# Patient Record
Sex: Female | Born: 1970 | Race: White | Hispanic: No | Marital: Married | Smoking: Current every day smoker
Health system: Southern US, Community
[De-identification: ages and names within clinical notes are randomized; demographics above are authoritative.]

## PROBLEM LIST (undated history)

## (undated) DIAGNOSIS — M797 Fibromyalgia: Secondary | ICD-10-CM

## (undated) DIAGNOSIS — F419 Anxiety disorder, unspecified: Secondary | ICD-10-CM

## (undated) DIAGNOSIS — I10 Essential (primary) hypertension: Secondary | ICD-10-CM

## (undated) HISTORY — PX: KNEE SURGERY: SHX244

## (undated) HISTORY — PX: TUBAL LIGATION: SHX77

## (undated) HISTORY — DX: Fibromyalgia: M79.7

## (undated) HISTORY — PX: CHOLECYSTECTOMY: SHX55

---

## 2005-05-20 HISTORY — PX: CARDIAC CATHETERIZATION: SHX172

## 2012-03-27 IMAGING — CT CT HEAD W/O CM
1 of 2 series · 13 of 30 positions shown, 17 images · non-contrast
Comparison: None

CT HEAD

CLINICAL DATA: Tingling left neck.

CT HEAD WITHOUT CONTRAST
CT CERVICAL SPINE WITHOUT CONTRAST
TECHNIQUE: Multidetector CT imaging of the head and cervical spine
was performed following the standard protocol without intravenous
contrast.  Multiplanar CT image reconstructions of the cervical
spine were also generated.

[Series 4: cervical spine · axial · 0.27mm/px · z∈[+87,+252]mm · 13 of 78 slices shown, 17 images]
[im 6/78  brain]
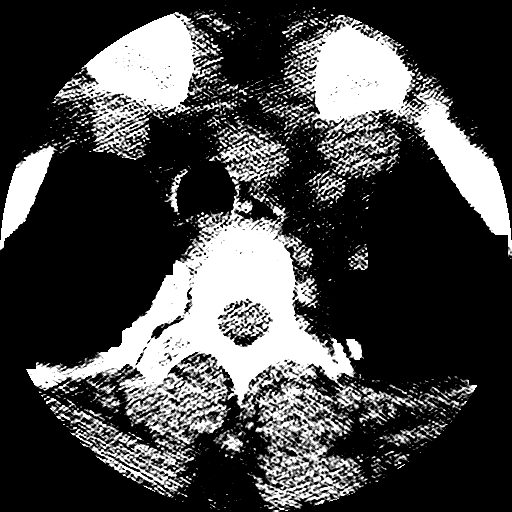
[im 6/78  bone]
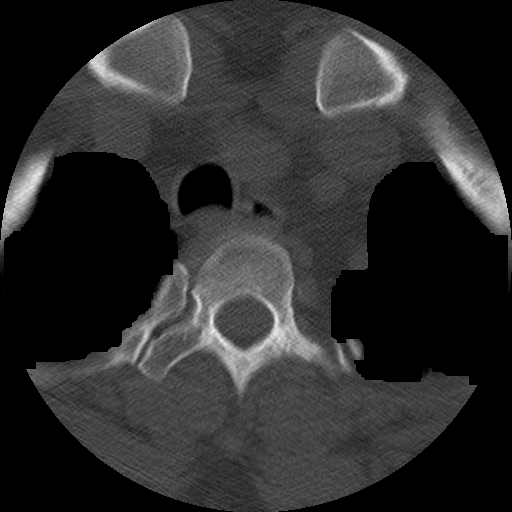
[im 12/78  brain]
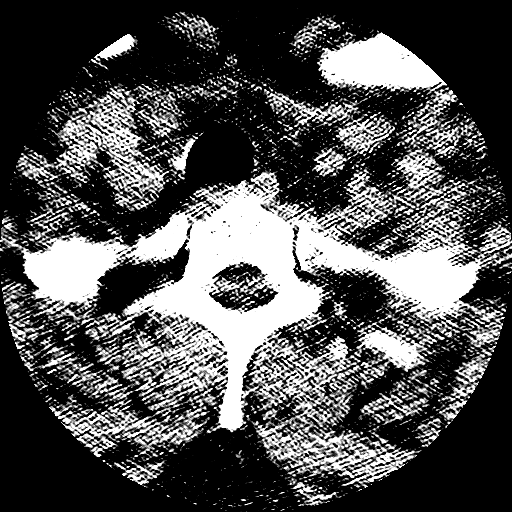
[im 17/78  brain]
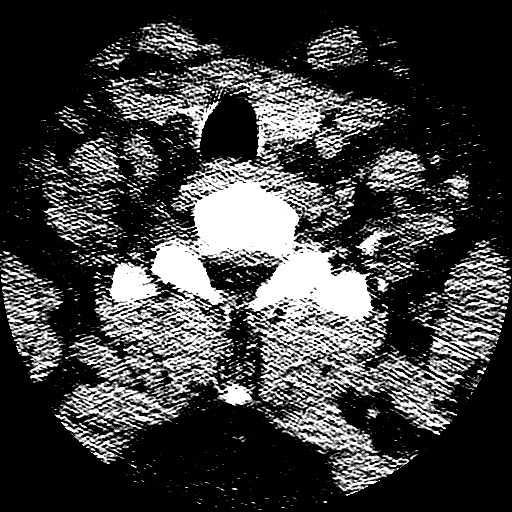
[im 23/78  brain]
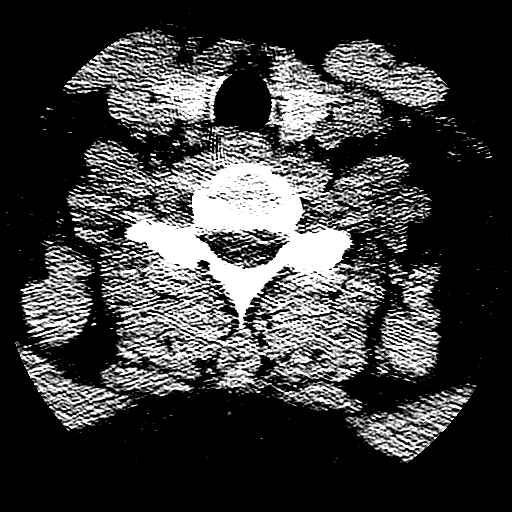
[im 28/78  brain]
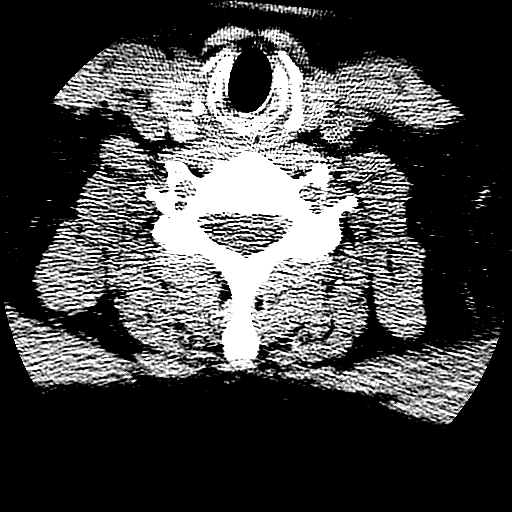
[im 28/78  bone]
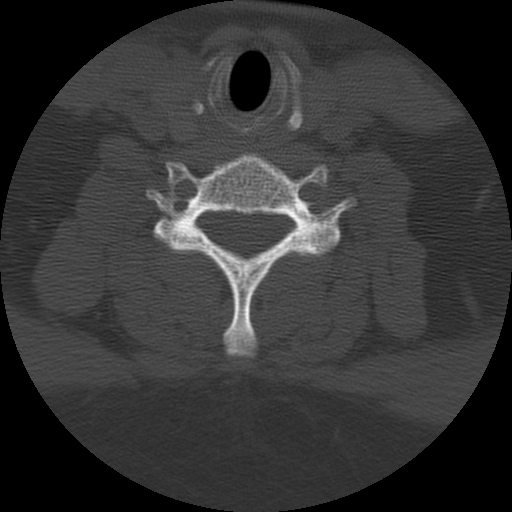
[im 34/78  brain]
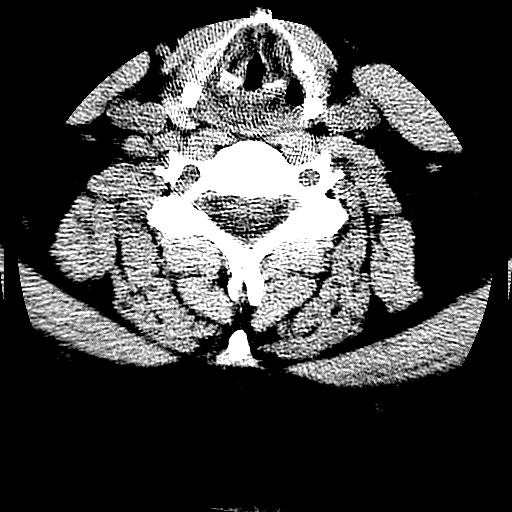
[im 39/78  brain]
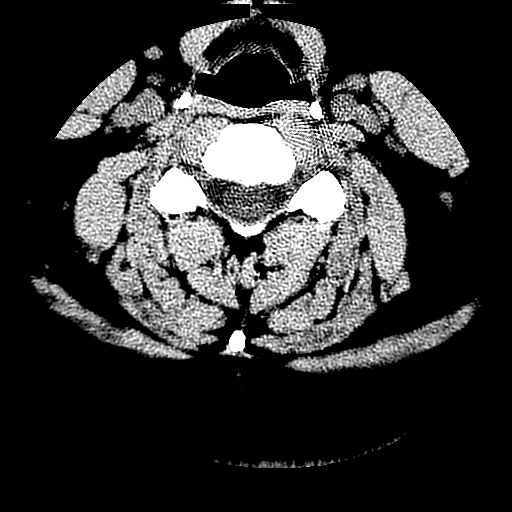
[im 45/78  brain]
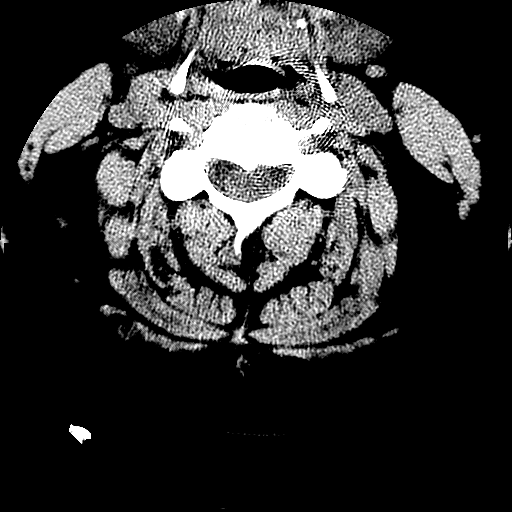
[im 50/78  brain]
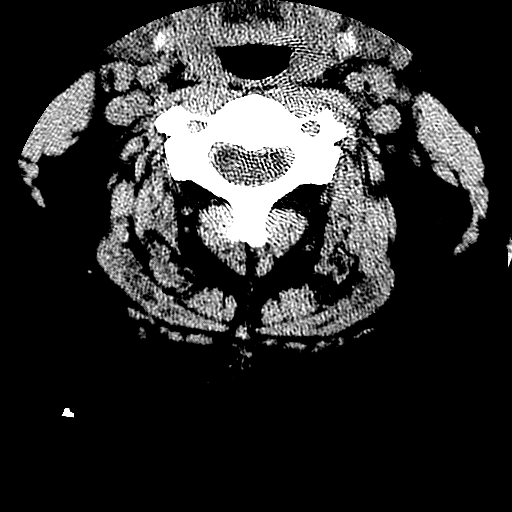
[im 50/78  bone]
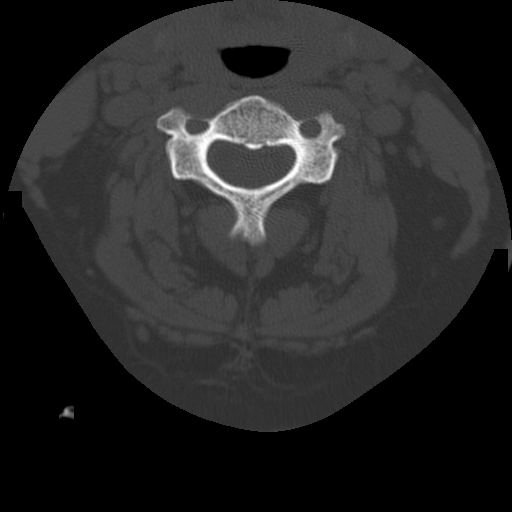
[im 56/78  brain]
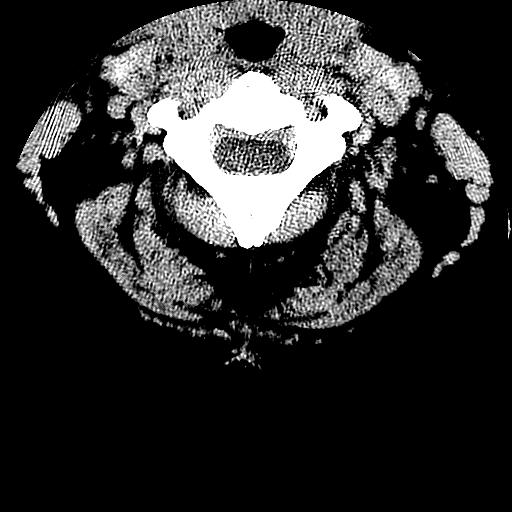
[im 61/78  brain]
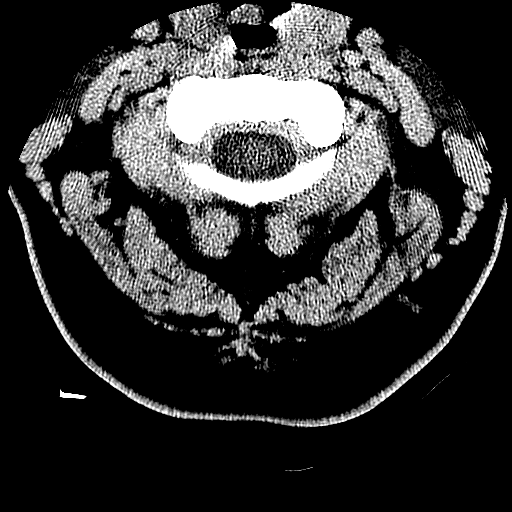
[im 67/78  brain]
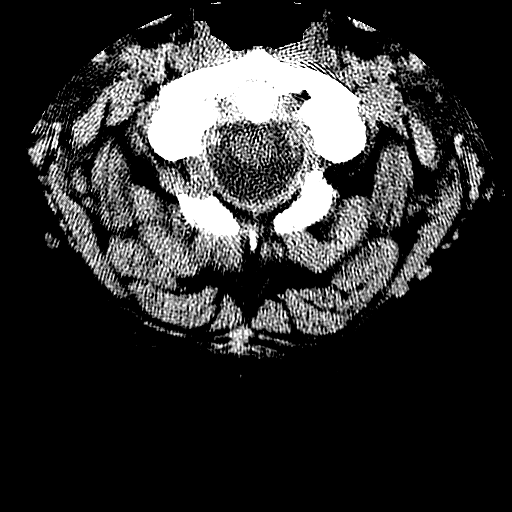
[im 72/78  brain]
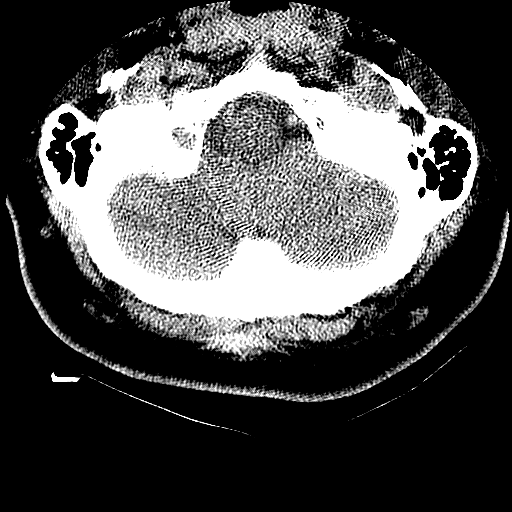
[im 72/78  bone]
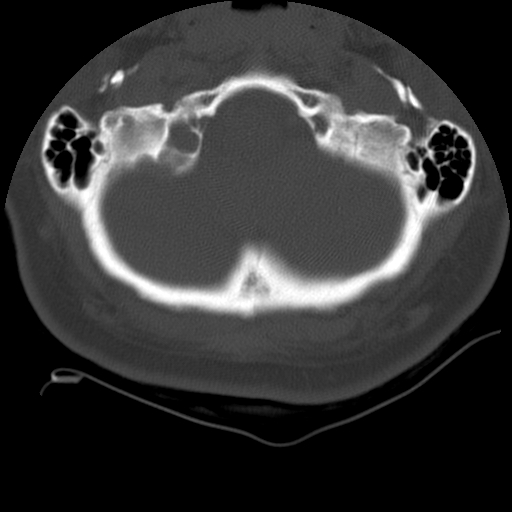

[13 of 30 positions shown; findings below may reference images not displayed]

FINDINGS: Ventricles are normal.  Negative for infarct or mass
lesion.  Negative for hemorrhage.  No evidence of demyelinating
disease.

There is mucosal thickening throughout the left sphenoid sinus
compatible with  chronic sinusitis.
IMPRESSION: Normal CT of the brain

Sphenoid chronic sinusitis

CT CERVICAL SPINE
FINDINGS: Normal cervical alignment.  Negative for fracture or mass
lesion.  Disc degeneration and mild spurring at C3-4.  No
significant spinal stenosis.
IMPRESSION: Mild cervical degenerative changes.  No acute abnormality.

## 2014-02-02 ENCOUNTER — Ambulatory Visit
Admission: RE | Admit: 2014-02-02 | Discharge: 2014-02-02 | Disposition: A | Discharge status: Home / Self Care | Payer: Self-pay | Source: Ambulatory Visit

## 2014-03-16 ENCOUNTER — Ambulatory Visit (RURAL_HEALTH_CENTER): Payer: Self-pay | Admitting: Family

## 2014-03-16 DIAGNOSIS — Z029 Encounter for administrative examinations, unspecified: Secondary | ICD-10-CM

## 2014-03-28 ENCOUNTER — Encounter (RURAL_HEALTH_CENTER): Payer: Self-pay | Admitting: Family

## 2014-03-30 ENCOUNTER — Ambulatory Visit (RURAL_HEALTH_CENTER): Payer: PRIVATE HEALTH INSURANCE | Admitting: Family

## 2014-03-30 ENCOUNTER — Telehealth (RURAL_HEALTH_CENTER): Payer: Self-pay

## 2014-03-30 ENCOUNTER — Encounter (RURAL_HEALTH_CENTER): Payer: Self-pay | Admitting: Family

## 2014-03-30 ENCOUNTER — Other Ambulatory Visit (RURAL_HEALTH_CENTER): Payer: Self-pay | Admitting: Family

## 2014-03-30 VITALS — BP 142/84 | HR 80 | Temp 97.8°F | Resp 16 | Ht 64.0 in | Wt 225.5 lb

## 2014-03-30 DIAGNOSIS — F419 Anxiety disorder, unspecified: Secondary | ICD-10-CM

## 2014-03-30 DIAGNOSIS — R52 Pain, unspecified: Secondary | ICD-10-CM

## 2014-03-30 DIAGNOSIS — Z7689 Persons encountering health services in other specified circumstances: Secondary | ICD-10-CM

## 2014-03-30 MED ORDER — GABAPENTIN 300 MG PO CAPS
300.0000 mg | ORAL_CAPSULE | Freq: Three times a day (TID) | ORAL | Status: DC
Start: ? — End: 2014-03-30

## 2014-03-30 MED ORDER — ALPRAZOLAM 0.25 MG PO TABS
0.2500 mg | ORAL_TABLET | Freq: Three times a day (TID) | ORAL | Status: DC | PRN
Start: ? — End: 2014-03-30

## 2014-03-30 NOTE — Progress Notes (Signed)
Subjective:    Patient ID: Anita Aguilar is a 43 y.o. female.    HPI     Patient presents today to establish care. She recently moved back to this area from [place]. She states Dr. Nedra Aguilar was her PCP years ago.    Patient complains of widespread pain. She states this has been going on for the past 4-5 months. She describes the pain as being in both the joints and the muscles. She states that she has a history of back pain and right knee pain. She states she has had surgery on the right knee. She states it hurts to move, and it even hurts to have her husband hold her hand. She states she has tried heat, ice, and Tylenol without improvement.    Patient also reports a history of a cardiac cath about 7 years ago at [facility] in [place], [place].  She is unsure whether she had stent placement.    Patient also requests a refill of her Xanax today which she states she uses for panic disorder.    Review of Systems   Constitutional: Negative for fever and chills.   HENT: Negative.    Respiratory: Negative for shortness of breath.    Cardiovascular: Negative for chest pain.   Gastrointestinal: Negative for vomiting, abdominal pain and diarrhea.   Genitourinary: Negative.    Musculoskeletal: Positive for myalgias (widespread) and arthralgias (widespread).   Skin: Negative for rash.   Neurological: Negative for headaches.   Psychiatric/Behavioral: Negative for suicidal ideas. The patient is nervous/anxious.            Objective:    Physical Exam   Constitutional: She is oriented to person, place, and time. She appears well-developed and well-nourished.   HENT:   Head: Normocephalic and atraumatic.   Mouth/Throat: Oropharynx is clear and moist.   Neck: Normal range of motion. Neck supple. No thyromegaly present.   Cardiovascular: Normal rate, regular rhythm and normal heart sounds.    Pulmonary/Chest: Effort normal and breath sounds normal.   Abdominal: Soft. Bowel sounds are normal. She exhibits no mass.  There is no tenderness.   Musculoskeletal:   Patient has multiple points of tenderness to palpation, both bony and muscular, found over the back as well as both the upper and lower extremities. No visible deformities. No warmth, redness, or bruising.   Neurological: She is alert and oriented to person, place, and time.   Skin: Skin is warm and dry.   Psychiatric: Her mood appears anxious (tearful at times).           Assessment:         establish care    Pain    Anxiety  Plan:       Records release from:  [facility] in [place], [place] ph: [phone];  [facility] in [place], [place] ph: [phone]: PCP "Anita Aguilar"; saw her 1-2 times; and [facility] in [place], [place]    Gabapentin 300 mg 3 times a day. I have given the patient instruction on how to slowly taper up her dose.    I've advised the patient to follow-up for fasting labs to further investigate this widespread pain.    The patient does have some Xanax remaining from her current prescription. I have advised her that once I review her records I will consider refilling this medication for her.    We will follow-up with the patient based on her results.  Further follow-up will be dictated by her lab  results as well.

## 2014-03-30 NOTE — Telephone Encounter (Signed)
-----   Message from Kalman Shan, NP sent at 03/30/2014  3:47 PM EST -----  Regarding: xanax Rx  Please advise the patient that I received her records from [facility], NP in [place].  (still waiting on the others).  I have provided her a refill of her xanax as requested.    Thanks,  [employer]

## 2014-03-30 NOTE — Telephone Encounter (Signed)
L/m to call the office.

## 2014-03-30 NOTE — Progress Notes (Signed)
Reviewed and agree with treatment plan.

## 2014-04-01 NOTE — Telephone Encounter (Signed)
Left message to return call 

## 2014-04-05 NOTE — Telephone Encounter (Signed)
L/m to call the office.

## 2014-04-06 NOTE — Telephone Encounter (Signed)
L/m to call the office.

## 2014-04-08 NOTE — Telephone Encounter (Signed)
Left message to return call 

## 2014-04-11 ENCOUNTER — Ambulatory Visit (RURAL_HEALTH_CENTER): Payer: PRIVATE HEALTH INSURANCE

## 2014-04-11 ENCOUNTER — Other Ambulatory Visit
Admission: RE | Admit: 2014-04-11 | Discharge: 2014-04-11 | Disposition: A | Discharge status: Home / Self Care | Payer: PRIVATE HEALTH INSURANCE | Source: Ambulatory Visit | Attending: Family | Admitting: Family

## 2014-04-11 ENCOUNTER — Other Ambulatory Visit (RURAL_HEALTH_CENTER): Payer: PRIVATE HEALTH INSURANCE | Admitting: Family

## 2014-04-11 DIAGNOSIS — E669 Obesity, unspecified: Secondary | ICD-10-CM

## 2014-04-11 LAB — COMPREHENSIVE METABOLIC PANEL
ALT: 20 U/L (ref 0–55)
AST (SGOT): 14 U/L (ref 10–42)
Albumin/Globulin Ratio: 1.31 Ratio (ref 0.70–1.50)
Albumin: 3.8 gm/dL (ref 3.5–5.0)
Alkaline Phosphatase: 64 U/L (ref 40–145)
Anion Gap: 14.2 mMol/L (ref 7.0–18.0)
BUN / Creatinine Ratio: 11.7 Ratio (ref 10.0–30.0)
BUN: 7 mg/dL (ref 7–22)
Bilirubin, Total: 0.4 mg/dL (ref 0.1–1.2)
CO2: 22.1 mMol/L (ref 20.0–30.0)
Calcium: 9.3 mg/dL (ref 8.5–10.5)
Chloride: 108 mMol/L (ref 98–110)
Creatinine: 0.6 mg/dL (ref 0.60–1.20)
EGFR: >60 mL/min/{1.73_m2}
Globulin: 2.9 gm/dL (ref 2.0–4.0)
Glucose: 105 mg/dL — ABNORMAL HIGH (ref 70–99)
Osmolality Calc: 278 mOsm/kg (ref 275–300)
Potassium: 4.3 mMol/L (ref 3.5–5.3)
Protein, Total: 6.7 gm/dL (ref 6.0–8.3)
Sodium: 140 mMol/L (ref 136–147)

## 2014-04-11 LAB — LIPID PANEL
Cholesterol: 176 mg/dL (ref 75–199)
Coronary Heart Disease Risk: 5.5
HDL: 32 mg/dL — ABNORMAL LOW (ref 45–65)
LDL Calculated: 120 mg/dL
Triglycerides: 120 mg/dL (ref 10–150)
VLDL: 24 (ref 0–40)

## 2014-04-11 LAB — CBC AND DIFFERENTIAL
Bands: 4 % (ref 0–10)
Basophils %: 1 % (ref 0.0–3.0)
Basophils Absolute: 0.1 10*3/uL (ref 0.0–0.3)
Eosinophils %: 1 % (ref 0.0–7.0)
Eosinophils Absolute: 0.1 10*3/uL (ref 0.0–0.8)
Hematocrit: 46.2 % (ref 36.0–48.0)
Hemoglobin: 15.6 gm/dL (ref 12.0–16.0)
Lymphocytes Absolute: 2 10*3/uL (ref 0.6–5.1)
Lymphocytes: 28 % (ref 15.0–46.0)
MCH: 30 pg (ref 28–35)
MCHC: 34 gm/dL (ref 32–36)
MCV: 90 fL (ref 80–100)
MPV: 9.2 fL (ref 6.0–10.0)
Monocytes Absolute: 0.5 10*3/uL (ref 0.1–1.7)
Monocytes: 7 % (ref 3.0–15.0)
Neutrophils %: 59 % (ref 42.0–78.0)
Neutrophils Absolute: 4.4 10*3/uL (ref 1.7–8.6)
PLT CT: 263 10*3/uL (ref 130–440)
RBC: 5.15 10*6/uL — ABNORMAL HIGH (ref 3.80–5.00)
RDW: 12.1 % (ref 11.0–14.0)
WBC: 7 10*3/uL (ref 4.0–11.0)

## 2014-04-11 LAB — POCT URINALYSIS DIPSTIX (10)(MULTI-TEST)
Bilirubin, UA POCT: NEGATIVE
Blood, UA POCT: NEGATIVE
Glucose, UA POCT: NEGATIVE mg/dL
Ketones, UA POCT: NEGATIVE mg/dL
Nitrite, UA POCT: NEGATIVE
POCT Leukocytes, UA: NEGATIVE
POCT Spec Gravity, UA: 1.01 (ref 1.001–1.035)
POCT pH, UA: 5 (ref 5–8)
Protein, UA POCT: NEGATIVE mg/dL
Urobilinogen, UA: 0.2 mg/dL

## 2014-04-11 LAB — TSH: TSH: 1.64 u[IU]/mL (ref 0.40–4.20)

## 2014-04-11 NOTE — Progress Notes (Signed)
Date Specimen Drawn:  04/11/2014   Time Specimen Drawn:  8:52 AM   Test(s) Ordered:  Cbc,cmp,lipid,tsh   Disposition:  n/a   Patient's Tolerance:  Good   Location Specimen Drawn:  Right hand

## 2014-04-11 NOTE — Telephone Encounter (Signed)
Pt in office for labwork aware of below note

## 2014-04-12 ENCOUNTER — Telehealth (RURAL_HEALTH_CENTER): Payer: Self-pay | Admitting: Family

## 2014-04-12 ENCOUNTER — Other Ambulatory Visit (RURAL_HEALTH_CENTER): Payer: Self-pay | Admitting: Family

## 2014-04-12 ENCOUNTER — Encounter (RURAL_HEALTH_CENTER): Payer: Self-pay | Admitting: Family

## 2014-04-12 ENCOUNTER — Other Ambulatory Visit
Admission: RE | Admit: 2014-04-12 | Discharge: 2014-04-12 | Disposition: A | Discharge status: Home / Self Care | Payer: PRIVATE HEALTH INSURANCE | Source: Ambulatory Visit | Attending: Family | Admitting: Family

## 2014-04-12 ENCOUNTER — Telehealth (RURAL_HEALTH_CENTER): Payer: Self-pay

## 2014-04-12 DIAGNOSIS — R7301 Impaired fasting glucose: Secondary | ICD-10-CM

## 2014-04-12 NOTE — Telephone Encounter (Signed)
L/m to call the office.

## 2014-04-12 NOTE — Telephone Encounter (Signed)
Pt aware.

## 2014-04-12 NOTE — Telephone Encounter (Signed)
-----   Message from Kalman Shan, NP sent at 04/12/2014 11:08 AM EST -----  Normal A1C.  No diabetes or pre-diabetes.

## 2014-04-12 NOTE — Telephone Encounter (Signed)
-----   Message from Kalman Shan, NP sent at 04/12/2014  8:26 AM EST -----  Labs look good overall.  I've added an A1C b/c fasting glucose was slightly elevated.  Will follow up with patient when that result is available.    Thanks,  [employer]

## 2014-04-13 NOTE — Telephone Encounter (Signed)
Pt aware.

## 2014-04-19 LAB — HEMOGLOBIN A1C: Hgb A1C, %: 5.6 %

## 2014-04-23 ENCOUNTER — Emergency Department: Payer: PRIVATE HEALTH INSURANCE

## 2014-04-23 ENCOUNTER — Emergency Department
Admission: EM | Admit: 2014-04-23 | Discharge: 2014-04-23 | Disposition: A | Discharge status: Home / Self Care | Payer: PRIVATE HEALTH INSURANCE | Attending: Emergency Medicine | Admitting: Emergency Medicine

## 2014-04-23 DIAGNOSIS — H6020 Malignant otitis externa, unspecified ear: Secondary | ICD-10-CM | POA: Insufficient documentation

## 2014-04-23 MED ORDER — CIPROFLOXACIN-DEXAMETHASONE 0.3-0.1 % OT SUSP
4.0000 [drp] | Freq: Two times a day (BID) | OTIC | Status: AC
Start: 2014-04-23 — End: 2014-04-30

## 2014-04-23 NOTE — ED Notes (Signed)
Neck stiffness/pain x 1 week - right ear pain

## 2014-04-23 NOTE — Discharge Instructions (Signed)
External Ear Infection (Adult)    External otitis (also called "swimmer's ear")is an infection in the ear canal. It is often caused by bacteria or fungus. It can occur a few days after water gets trapped in the ear canal (fromswimming or bathing). It can also occur after cleaning too deeply in the ear canal with a cotton swab or other object. Sometimes, hair care products get into the ear canal and cause this problem.  Symptoms can include pain,fever,itching, redness, drainage, or swelling of the ear canal. Temporary hearing loss may also occur.  Home care   Do not try to clean the ear canal. This can push pus and bacteria deeper into the canal.   Use prescribed ear drops as directed. These help reduce swelling and fight the infection. If an ear wick was placed in the ear canal, apply drops right onto the end of the wick. The wick will draw the medication into the ear canal even if it is swollen closed.   A cotton ball may be loosely placed in the outer ear to absorb any drainage.   You may use acetaminophen or ibuprofen to control pain, unless another medication was prescribed. Note: If you have chronic liver or kidney disease or ever had a stomach ulcer or GI bleeding, talk to your health care provider before taking any of these medications.   Do not allow water to get into your ear when bathing. Also, avoid swimming until the infection has cleared.  Prevention   Keep your ears dry. This helps lower the risk of infection. Dry your ears with a towel or hair dryer after getting wet. Also, use ear plugs when swimming.   Do not stick any objects in the ear to remove wax.   If you feel water trapped in your ear, use ear drops right away. You can get these drops over the counter at most drugstores. They work by removing water from the ear canal.  Follow-up care  Follow up with your health care provider in one week, or as advised.  When to seek medical advice  Call your health care provider right away if  any of these occur:   Ear pain becomes worse or doesn't improve after 3 days of treatment   Redness or swelling of the outer ear occurs or gets worse   Headache   Painful or stiff neck   Drowsiness or confusion   Fever of 100.4F (38C) or higher, or as directed by your health care provider   Seizure   2000-2015 [employer] [address], [place] [address]. All rights reserved. This information is not intended as a substitute for professional medical care. Always follow your healthcare professional's instructions.

## 2014-04-23 NOTE — ED Provider Notes (Addendum)
Physician/Midlevel provider first contact with patient: 04/23/14 1307         History     Chief Complaint   Patient presents with   . Neck Pain     Patient is a 43 y.o. female presenting with neck pain and ear pain. The history is provided by the patient. No language interpreter was used.   Neck Pain  Pain location:  R side  Quality:  Aching  Pain radiates to:  Does not radiate  Pain severity:  Mild  Duration:  3 days  Timing:  Constant  Chronicity:  New  Context: not recent injury    Associated symptoms: no bladder incontinence, no bowel incontinence, no fever, no headaches, no numbness and no paresis    Otalgia  Location:  Right (Right greater than left)  Behind ear:  No abnormality  Quality:  Aching (Tinnitus with soreness)  Severity:  Moderate  Onset quality:  Gradual  Duration:  1 week  Timing:  Constant  Associated symptoms: neck pain    Associated symptoms: no abdominal pain, no congestion, no ear discharge, no fever, no headaches, no rash, no rhinorrhea and no sore throat             History reviewed. No pertinent past medical history.    Past Surgical History   Procedure Laterality Date   . Tubal ligation     . Knee surgery     . Cholecystectomy     . Cardiac catheterization  2007       History reviewed. No pertinent family history.    Social  History   Substance Use Topics   . Smoking status: Current Every Day Smoker -- 1.00 packs/day     Types: Cigarettes   . Smokeless tobacco: Never Used   . Alcohol Use: No       .     Allergies   Allergen Reactions   . Asa [Aspirin]    . Ativan [Lorazepam]    . Codeine    . Klonopin [Clonazepam]    . Morphine    . Penicillins    . Zoloft [Sertraline]        Current/Home Medications    ALPRAZOLAM (XANAX) 0.25 MG TABLET    Take 1 tablet (0.25 mg total) by mouth 3 (three) times daily as needed.        Review of Systems   Constitutional: Negative for fever.   HENT: Positive for ear pain. Negative for congestion, ear discharge, rhinorrhea and sore throat.     Gastrointestinal: Negative for abdominal pain and bowel incontinence.   Genitourinary: Negative for bladder incontinence.   Musculoskeletal: Positive for neck pain.   Skin: Negative for rash.   Neurological: Negative for numbness and headaches.   All other systems reviewed and are negative.      Physical Exam    BP: 138/86 mmHg, Heart Rate: 90, Temp: 99 F (37.2 C), Resp Rate: 18, SpO2: 98 %, Weight: 99.791 kg    Physical Exam   Constitutional: She is oriented to person, place, and time. She appears well-developed and well-nourished.   HENT:   Head: Normocephalic and atraumatic.   There is clinical external otitis in the right ear. The TM appears to be normal. There is mild tragal tenderness. The pinna is normal. There is no discharge. The external canal is widely patent. The left ear shows a normal TM, normal pinna and tragus. There is perhaps minimal external otitis in this ear as well.  Neck: Normal range of motion. Neck supple.   Mild tenderness right paraspinous and trapezius. No supraspinous tenderness. No swelling. No rash. No mass   Neurological: She is alert and oriented to person, place, and time. No cranial nerve deficit. Coordination normal.   Skin: Skin is warm. No rash noted.   Psychiatric: She has a normal mood and affect. Her behavior is normal.         MDM and ED Course     ED Medication Orders     None              MDM       Procedures    Clinical Impression & Disposition     Clinical Impression  Final diagnoses:   Acute malignant otitis externa, unspecified laterality        ED Disposition     Discharge Kathreen Cosier Mizzell discharge to home/self care.    Condition at disposition: Stable             New Prescriptions    CIPROFLOXACIN-DEXAMETHASONE (CIPRODEX) OTIC SUSPENSION    Place 4 drops into both ears 2 (two) times daily.                   Charissa Bash, MD  04/23/14 1335    Charissa Bash, MD  04/23/14 [phone]

## 2014-05-01 ENCOUNTER — Emergency Department: Payer: PRIVATE HEALTH INSURANCE

## 2014-05-01 ENCOUNTER — Emergency Department
Admission: EM | Admit: 2014-05-01 | Discharge: 2014-05-01 | Disposition: A | Discharge status: Home / Self Care | Payer: PRIVATE HEALTH INSURANCE | Attending: Emergency Medicine | Admitting: Emergency Medicine

## 2014-05-01 DIAGNOSIS — R110 Nausea: Secondary | ICD-10-CM | POA: Insufficient documentation

## 2014-05-01 DIAGNOSIS — R630 Anorexia: Secondary | ICD-10-CM | POA: Insufficient documentation

## 2014-05-01 DIAGNOSIS — R109 Unspecified abdominal pain: Secondary | ICD-10-CM | POA: Insufficient documentation

## 2014-05-01 DIAGNOSIS — N939 Abnormal uterine and vaginal bleeding, unspecified: Secondary | ICD-10-CM | POA: Insufficient documentation

## 2014-05-01 HISTORY — DX: Anxiety disorder, unspecified: F41.9

## 2014-05-01 LAB — URINALYSIS WITH MICROSCOPIC, REFLEX COMPREHENSIVE URINE CULTURE
Bilirubin, UA: NEGATIVE mg/dL
Glucose, UA: NEGATIVE mg/dL
Ketones UA: NEGATIVE mg/dL
Leukocyte Esterase, UA: NEGATIVE Leu/uL
Nitrite, UA: NEGATIVE
Protein, UR: NEGATIVE mg/dL
Urine Specific Gravity: 1.015 (ref 1.001–1.040)
Urobilinogen, UA: 0.2 mg/dL
pH, Urine: 6.5 pH (ref 5.0–8.0)

## 2014-05-01 LAB — COMPREHENSIVE METABOLIC PANEL
ALT: 24 U/L (ref 0–55)
AST (SGOT): 17 U/L (ref 10–42)
Albumin/Globulin Ratio: 1.14 Ratio (ref 0.70–1.50)
Albumin: 3.6 gm/dL (ref 3.5–5.0)
Alkaline Phosphatase: 56 U/L (ref 40–145)
Anion Gap: 14.2 mMol/L (ref 7.0–18.0)
BUN / Creatinine Ratio: 10 Ratio (ref 10.0–30.0)
BUN: 6 mg/dL — ABNORMAL LOW (ref 7–22)
Bilirubin, Total: 0.4 mg/dL (ref 0.1–1.2)
CO2: 22 mMol/L (ref 20.0–30.0)
Calcium: 9.3 mg/dL (ref 8.5–10.5)
Chloride: 107 mMol/L (ref 98–110)
Creatinine: 0.6 mg/dL (ref 0.60–1.20)
EGFR: >60 mL/min/{1.73_m2}
Globulin: 3.2 gm/dL (ref 2.0–4.0)
Glucose: 101 mg/dL — ABNORMAL HIGH (ref 70–99)
Osmolality Calc: 276 mOsm/kg (ref 275–300)
Potassium: 3.8 mMol/L (ref 3.5–5.3)
Protein, Total: 6.8 gm/dL (ref 6.0–8.3)
Sodium: 140 mMol/L (ref 136–147)

## 2014-05-01 LAB — CBC AND DIFFERENTIAL
Basophils %: 1.2 % (ref 0.0–3.0)
Basophils Absolute: 0.1 10*3/uL (ref 0.0–0.3)
Eosinophils %: 1.6 % (ref 0.0–7.0)
Eosinophils Absolute: 0.1 10*3/uL (ref 0.0–0.8)
Hematocrit: 44.5 % (ref 36.0–48.0)
Hemoglobin: 15.6 gm/dL (ref 12.0–16.0)
Lymphocytes Absolute: 2.1 10*3/uL (ref 0.6–5.1)
Lymphocytes: 26 % (ref 15.0–46.0)
MCH: 30 pg (ref 28–35)
MCHC: 35 gm/dL (ref 31–36)
MCV: 85 fL (ref 80–100)
MPV: 9 fL (ref 6.0–10.0)
Monocytes Absolute: 0.7 10*3/uL (ref 0.1–1.7)
Monocytes: 8.3 % (ref 3.0–15.0)
Neutrophils %: 62.9 % (ref 42.0–78.0)
Neutrophils Absolute: 5.1 10*3/uL (ref 1.7–8.6)
PLT CT: 274 10*3/uL (ref 130–440)
RBC: 5.2 10*6/uL — ABNORMAL HIGH (ref 3.80–5.00)
RDW: 11.5 % (ref 10.5–14.5)
WBC: 8.1 10*3/uL (ref 4.00–11.00)

## 2014-05-01 LAB — URINE HCG QUALITATIVE: Urine HCG Qualitative: NEGATIVE

## 2014-05-01 MED ORDER — ONDANSETRON HCL 4 MG PO TABS
4.0000 mg | ORAL_TABLET | Freq: Three times a day (TID) | ORAL | Status: DC | PRN
Start: 2014-05-01 — End: 2014-06-17

## 2014-05-01 MED ORDER — METRONIDAZOLE 500 MG PO TABS
500.0000 mg | ORAL_TABLET | Freq: Three times a day (TID) | ORAL | Status: DC
Start: 2014-05-01 — End: 2014-05-01

## 2014-05-01 MED ORDER — PROMETHAZINE HCL 25 MG PO TABS
25.0000 mg | ORAL_TABLET | Freq: Four times a day (QID) | ORAL | Status: DC | PRN
Start: 2014-05-01 — End: 2014-05-01

## 2014-05-01 MED ORDER — HYDROMORPHONE HCL 2 MG PO TABS
2.0000 mg | ORAL_TABLET | Freq: Four times a day (QID) | ORAL | Status: DC | PRN
Start: 2014-05-01 — End: 2014-05-01

## 2014-05-01 MED ORDER — ONDANSETRON HCL 4 MG/2ML IJ SOLN
4.0000 mg | Freq: Once | INTRAMUSCULAR | Status: AC
Start: 2014-05-01 — End: 2014-05-01
  Administered 2014-05-01: 4 mg via INTRAVENOUS

## 2014-05-01 MED ORDER — HYDROCODONE-ACETAMINOPHEN 5-325 MG PO TABS
1.0000 | ORAL_TABLET | Freq: Four times a day (QID) | ORAL | Status: DC | PRN
Start: 2014-05-01 — End: 2014-06-17

## 2014-05-01 MED ORDER — HYDROMORPHONE HCL 2 MG/ML IJ SOLN
1.0000 mg | Freq: Once | INTRAMUSCULAR | Status: AC
Start: 2014-05-01 — End: 2014-05-01
  Administered 2014-05-01: 1 mg via INTRAVENOUS

## 2014-05-01 MED ORDER — CIPROFLOXACIN HCL 500 MG PO TABS
500.0000 mg | ORAL_TABLET | Freq: Two times a day (BID) | ORAL | Status: DC
Start: 2014-05-01 — End: 2014-05-01

## 2014-05-01 MED ORDER — SODIUM CHLORIDE 0.9 % IV BOLUS
1000.0000 mL | Freq: Once | INTRAVENOUS | Status: AC
Start: 2014-05-01 — End: 2014-05-01
  Administered 2014-05-01: 1000 mL via INTRAVENOUS

## 2014-05-01 NOTE — ED Notes (Signed)
Bed: ED5-A  Expected date:   Expected time:   Means of arrival:   Comments:  EMS

## 2014-05-01 NOTE — Discharge Instructions (Signed)
Abdominal Pain, Unknown Cause (Female)    The exact cause of your abdominal (stomach) pain is not certain. This does not mean that this is something to worry about, or the right tests were not done. Everyone likes to know the exact cause of the problem, but sometimes with abdominal pain, there is no clear-cut cause, and this could be a good thing. The good news is that your symptoms can be treated, and you will feel better.  Your condition does not seem serious now; however, sometimes the signs of a serious problem may take more time to appear. For this reason,it is important for you to watch for any new symptoms, problems,or worsening of your condition.  Over the next few days, the abdominal pain may come and go, or be continuous. Other common symptoms can include nausea and vomiting. Sometimes it can be difficult to tell if you feel nauseous, you may just feel bad and not associate that feeling with nausea. Constipation, diarrhea, and a fever may go along with the pain.  The pain may continue even if treated correctly over the following days. Depending on how things go, sometimes the cause can become clear and may require further or different treatment. Additional evaluations, medications, or tests may be needed.  Home care  Your health care provider may prescribe medications for pain, symptoms, or an infection. Follow the health care provider's instructions for taking these medications.  General care   Rest until your next exam. No strenuous activities.   Try to find positions that ease discomfort. A small pillow placed on the abdomen may help relieve pain.   Something warm on your abdomen (such as a heating pad) may help, but be careful not to burn yourself.  Diet   Do not force yourself to eat, especially if having cramps, vomiting, or diarrhea.   Water is important so you do not get dehydrated. Soup may also be good. Sports drinks may also help, especially if they are not too acidic. Make sure you  don't drink sugary drinks as this can make things worse. Take liquids in small amounts. Do not guzzle them.   Caffeine sometimes makes the pain and cramping worse.   Avoid dairy products if you have vomiting or diarrhea.   Don't eat large amounts at a time. Wait a few minutes between bites.   Eat a diet low in fiber (called a low-residue diet). Foods allowed include refined breads, white rice, fruit and vegetable juices without pulp, tender meats. These foods will pass more easily through the intestine.   Avoid whole-grain foods, whole fruits and vegetables, meats, seeds and nuts, fried or fatty foods, dairy, alcohol and spicy foods until your symptoms go away.  Follow-up care  Follow up with your health care provider as instructed, or if your pain does not begin to improve in the next 24 hours.  When to seek medical advice  Call your health care provider right away if any of these occur:   Pain gets worse or moves to the right lower abdomen   New or worsening vomiting or diarrhea   Swelling of the abdomen   Unable to pass stool for more than three days   Fever of 100.4F (38C) or higher, or as directed by your healthcare provider.   Blood in vomit or bowel movements (dark red or black color)   Jaundice (yellow color of eyes and skin)   Weakness, dizziness   Chest, arm, back, neck or jaw pain   Unexpected   vaginal bleeding or missed period  Call 911  Call emergency services if any of these occur:   Trouble breathing   Confusion   Fainting or loss of consciousness   Rapid heart rate   Seizure   2000-2015 [employer] [address], [place] [address]. All rights reserved. This information is not intended as a substitute for professional medical care. Always follow your healthcare professional's instructions.

## 2014-05-01 NOTE — ED Provider Notes (Addendum)
Physician/Midlevel provider first contact with Anita: 05/01/14 1221         History     Chief Complaint   Anita presents with   . Abdominal Pain     HPI Comments: Pt presents for initial evaluation of abd pain.  Pt woke about 2 am with severe abd pain, a serve discomfort in the RLQ without radiation and no aggravate or alleviate factors.  Pt also with nausea but no emesis.  No fever.  No urine changes    Anita is a 43 y.o. female presenting with abdominal pain. The history is provided by the Anita.   Abdominal Pain  Pain location:  RUQ  Pain quality: aching    Pain radiates to:  Does not radiate  Pain severity:  Severe  Onset quality:  Sudden  Duration:  10 hours  Timing:  Constant  Progression:  Worsening  Chronicity:  New  Relieved by:  Nothing  Worsened by:  Nothing tried  Associated symptoms: anorexia, nausea and vaginal bleeding (finished menses ysterday)    Associated symptoms: no chest pain, no chills, no cough, no diarrhea, no dysuria, no fever, no shortness of breath, no sore throat and no vomiting             Past Medical History   Diagnosis Date   . Anxiety        Past Surgical History   Procedure Laterality Date   . Tubal ligation     . Knee surgery     . Cholecystectomy     . Cardiac catheterization  2007       History reviewed. No pertinent family history.    Social  History   Substance Use Topics   . Smoking status: Current Every Day Smoker -- 1.00 packs/day     Types: Cigarettes   . Smokeless tobacco: Never Used   . Alcohol Use: No       .     Allergies   Allergen Reactions   . Asa [Aspirin]    . Ativan [Lorazepam]    . Codeine    . Klonopin [Clonazepam]    . Morphine    . Penicillins    . Zoloft [Sertraline]        Current/Home Medications    ALPRAZOLAM (XANAX) 0.25 MG TABLET    Take 1 tablet (0.25 mg total) by mouth 3 (three) times daily as needed.        Review of Systems   Constitutional: Positive for appetite change. Negative for fever, chills and activity change.   HENT: Negative for  congestion, ear pain, rhinorrhea, sinus pressure and sore throat.    Eyes: Negative for visual disturbance.   Respiratory: Negative for cough and shortness of breath.    Cardiovascular: Negative for chest pain and leg swelling.   Gastrointestinal: Positive for nausea, abdominal pain and anorexia. Negative for vomiting and diarrhea.   Genitourinary: Positive for flank pain and vaginal bleeding (finished menses ysterday). Negative for dysuria, urgency, frequency and difficulty urinating.   Musculoskeletal: Negative for myalgias, back pain, arthralgias and neck pain.   Skin: Negative for rash and wound.   Neurological: Negative for dizziness, weakness, light-headedness and headaches.   Psychiatric/Behavioral: Negative for confusion.   All other systems reviewed and are negative.      Physical Exam    BP: 154/83 mmHg, Heart Rate: 79, Temp: 97.9 F (36.6 C), Resp Rate: 18, SpO2: 98 %, Weight: 99.791 kg    Physical Exam   Constitutional:  She is oriented to person, place, and time. She appears well-developed and well-nourished. She appears distressed.   HENT:   Head: Normocephalic and atraumatic.   Right Ear: External ear normal.   Left Ear: External ear normal.   Nose: Nose normal.   Mouth/Throat: Oropharynx is clear and moist. No oropharyngeal exudate.   Eyes: Conjunctivae and EOM are normal. Pupils are equal, round, and reactive to light. Right eye exhibits no discharge. Left eye exhibits no discharge. No scleral icterus.   Neck: Normal range of motion. Neck supple.   Cardiovascular: Normal rate, regular rhythm and normal heart sounds.  Exam reveals no gallop and no friction rub.    No murmur heard.  Pulmonary/Chest: Effort normal and breath sounds normal. No respiratory distress. She has no wheezes. She has no rales. She exhibits no tenderness.   Abdominal: Soft. She exhibits no distension and no mass. There is tenderness (RLQ tender). There is no rebound and no guarding.   Musculoskeletal: Normal range of motion. She  exhibits no edema or tenderness.   Lymphadenopathy:     She has no cervical adenopathy.   Neurological: She is alert and oriented to person, place, and time. No cranial nerve deficit. She exhibits normal muscle tone.   Skin: Skin is warm and dry. No rash noted. She is not diaphoretic. No erythema.   Psychiatric: She has a normal mood and affect. Her behavior is normal.   Nursing note and vitals reviewed.        MDM and ED Course     ED Medication Orders     Start     Status Ordering Provider    05/01/14 1231  sodium chloride 0.9 % bolus 1,000 mL   Once in ED     Route: Intravenous  Ordered Dose: 1,000 mL     Last MAR action:  New Bag Treniyah Lynn W    05/01/14 1231  HYDROmorphone (DILAUDID) injection 1 mg   Once in ED     Route: Intravenous  Ordered Dose: 1 mg     Last MAR action:  Given Chucky May    05/01/14 1231  ondansetron (ZOFRAN) injection 4 mg   Once in ED     Route: Intravenous  Ordered Dose: 4 mg     Last MAR action:  Given Chucky May           Labs     Results     Procedure Component Value Units Date/Time    Comprehensive metabolic panel [[id number]]  (Abnormal) Collected:  05/01/14 1317    Specimen Information:  Plasma Updated:  05/01/14 1424     Sodium 140 mMol/L      Potassium 3.8 mMol/L      Chloride 107 mMol/L      CO2 22.0 mMol/L      CALCIUM 9.3 mg/dL      Glucose 409 (H) mg/dL      Creatinine 8.11 mg/dL      BUN 6 (L) mg/dL      Protein, Total 6.8 gm/dL      Albumin 3.6 gm/dL      Alkaline Phosphatase 56 U/L      ALT 24 U/L      AST (SGOT) 17 U/L      Bilirubin, Total 0.4 mg/dL      Albumin/Globulin Ratio 1.14 Ratio      Anion Gap 14.2 mMol/L      BUN/Creatinine Ratio 10.0 Ratio      EGFR >  60 mL/min/1.54m2      Osmolality Calc 276 mOsm/kg      Globulin 3.2 gm/dL     CBC and differential [[id number]]  (Abnormal) Collected:  05/01/14 1317    Specimen Information:  Blood / Blood Updated:  05/01/14 1400     WBC 8.1 K/cmm      RBC 5.20 (H) M/cmm      Hemoglobin 15.6 gm/dL      Hematocrit  47.8 %      MCV 85 fL      MCH 30 pg      MCHC 35 gm/dL      RDW 29.5 %      PLT CT 274 K/cmm      MPV 9.0 fL      NEUTROPHIL % 62.9 %      Lymphocytes 26.0 %      Monocytes 8.3 %      Eosinophils % 1.6 %      Basophils % 1.2 %      Neutrophils Absolute 5.1 K/cmm      Lymphocytes Absolute 2.1 K/cmm      Monocytes Absolute 0.7 K/cmm      Eosinophils Absolute 0.1 K/cmm      BASO Absolute 0.1 K/cmm     HCG, Qualitative, Urine [[id number]] Collected:  05/01/14 1245    Specimen Information:  Urine, Random Updated:  05/01/14 1307     human chorionic gonadotropin (hCG), UR, Qual. Negative     UA w/Micro,Reflex Comprehensive Urine Cx [[id number]]  (Abnormal) Collected:  05/01/14 1245    Specimen Information:  Urine, Random Updated:  05/01/14 1306     Color, UA Yellow      Clarity, UA Slightly Cloudy (A)      Specific Gravity, UR 1.015      pH, Urine 6.5 pH      Protein, UR Negative mg/dL      Glucose, UA Negative mg/dL      Ketones UA Negative mg/dL      Bilirubin, UA Negative mg/dL      Blood, UA 3+ (A) mg/dL      Nitrite, UA Negative      Urobilinogen, UA 0.2 mg/dL      Leukocyte Esterase, UA Negative Leu/uL      UR Micro Performed      WBC, UA 3-4 /hpf      RBC, UA 20-30 (A) /hpf      Bacteria, UA Moderate (A) /hpf      Squam Epithel, UA TNTC (A) /lpf     Narrative:      A Urine Culture has been ordered based upon the Positive UA results.              Radiologic Studies  Radiology Results (24 Hour)     Procedure Component Value Units Date/Time    CT Abdomen Pelvis WO IV/ WO PO Cont [[id number]] Collected:  05/01/14 1304    Order Status:  Completed Updated:  05/01/14 1309    Narrative:      Clinical History:  right flank pain    Examination:  CT of the abdomen and pelvis without contrast. Multiplanar reconstructions obtained.    Comparison:  None available.    Findings:  The exam is technically limited by lack of intravenous contrast.     Lung bases are clear.    The liver is within normal limits. Clips in the gallbladder  fossa from cholecystectomy. The spleen, pancreas, and  adrenal glands are normal  in appearance. The kidneys are normal in appearance. There are no obstructive changes. There  are no ureteral stones.    Abdominal aorta is normal in caliber with mild atherosclerotic changes.    Imaging of the pelvis demonstrates no free fluid. Urinary bladder appears unremarkable. Uterus and ovaries are normal in  appearance.    The bowel loops are unremarkable in appearance. The appendix is normal.    Mild spondylitic changes L4-5 and L5-S1.      Impression:      No acute changes identified within the abdomen and pelvis. In particular no renal or ureteral stones identified.    ReadingStation:[id number]      .           MDM  Number of Diagnoses or Management Options  Abdominal pain of unknown etiology: new and requires workup     Amount and/or Complexity of Data Reviewed  Clinical lab tests: reviewed and ordered  Tests in the radiology section of CPT: ordered and reviewed    Risk of Complications, Morbidity, and/or Mortality  Presenting problems: moderate  Diagnostic procedures: moderate  Management options: moderate  General comments: Discussed results with pt and need for f/u    Anita Progress  Anita progress: improved         Procedures    Clinical Impression & Disposition     Clinical Impression  Final diagnoses:   Abdominal pain of unknown etiology        ED Disposition     Discharge Kathreen Cosier Soderlund discharge to home/self care.    Condition at disposition: Stable             New Prescriptions    HYDROCODONE-ACETAMINOPHEN (NORCO) 5-325 MG PER TABLET    Take 1 tablet by mouth every 6 (six) hours as needed for Pain.    ONDANSETRON (ZOFRAN) 4 MG TABLET    Take 1 tablet (4 mg total) by mouth every 8 (eight) hours as needed for Nausea.                   Chucky May, MD  05/01/14 1356    Chucky May, MD  05/01/14 1405    Chucky May, MD  05/01/14 [phone]

## 2014-05-05 ENCOUNTER — Other Ambulatory Visit (RURAL_HEALTH_CENTER): Payer: Self-pay

## 2014-05-05 MED ORDER — ALPRAZOLAM 0.25 MG PO TABS
0.2500 mg | ORAL_TABLET | Freq: Three times a day (TID) | ORAL | Status: DC | PRN
Start: ? — End: 2014-05-05

## 2014-05-08 ENCOUNTER — Other Ambulatory Visit (RURAL_HEALTH_CENTER): Payer: Self-pay | Admitting: Family

## 2014-06-17 ENCOUNTER — Ambulatory Visit (RURAL_HEALTH_CENTER): Payer: PRIVATE HEALTH INSURANCE | Admitting: Family

## 2014-06-17 ENCOUNTER — Encounter (RURAL_HEALTH_CENTER): Payer: Self-pay | Admitting: Family

## 2014-06-17 ENCOUNTER — Other Ambulatory Visit
Admission: RE | Admit: 2014-06-17 | Discharge: 2014-06-17 | Disposition: A | Discharge status: Home / Self Care | Payer: PRIVATE HEALTH INSURANCE | Source: Ambulatory Visit | Attending: Family | Admitting: Family

## 2014-06-17 VITALS — BP 150/92 | HR 88 | Temp 98.1°F | Resp 16 | Ht 64.02 in | Wt 232.0 lb

## 2014-06-17 DIAGNOSIS — F419 Anxiety disorder, unspecified: Secondary | ICD-10-CM

## 2014-06-17 DIAGNOSIS — M2550 Pain in unspecified joint: Secondary | ICD-10-CM

## 2014-06-17 DIAGNOSIS — M791 Myalgia, unspecified site: Secondary | ICD-10-CM

## 2014-06-17 LAB — SEDIMENTATION RATE: Sed Rate: 15 mm/hr (ref 0–20)

## 2014-06-17 LAB — CK: Creatine Kinase (CK): 67 U/L (ref 30–189)

## 2014-06-17 LAB — RHEUMATOID FACTOR: Rheumatoid Factor: <15 IU/mL (ref 0.0–29.9)

## 2014-06-17 LAB — C-REACTIVE PROTEIN: C-Reactive Protein: 0.11 mg/dL (ref 0.02–0.80)

## 2014-06-17 MED ORDER — ALPRAZOLAM 0.25 MG PO TABS
0.2500 mg | ORAL_TABLET | Freq: Three times a day (TID) | ORAL | Status: DC | PRN
Start: ? — End: 2014-06-17

## 2014-06-17 MED ORDER — PREDNISONE (PAK) 10 MG PO TABS
ORAL_TABLET | ORAL | Status: DC
Start: ? — End: 2014-06-17

## 2014-06-17 NOTE — Progress Notes (Signed)
Date Specimen Drawn:  06/17/2014   Time Specimen Drawn:  11:55 AM   Test(s) Ordered:  Lyme AB, C-Reactive Protein, Rheumatoid Factor, ANA, Sed-Rate, CK,    Disposition:  n/a   Patient's Tolerance:  Pt very difficult stick, first attempt unsuccessful in rt antecubital, second attempt even though patient states more painful, tolerated in rt hand fifth knuckle.   Location Specimen Drawn:  Right hand

## 2014-06-17 NOTE — Progress Notes (Signed)
Subjective:    Patient ID: Anita Aguilar is a 44 y.o. female.    HPI    Pt presents today for follow-up on her widespread pain and anxiety. Patient is tearful and continues to report pain in all joints and muscles on a daily basis. She states it is difficult for her to get out of bed in the mornings due to this pain. She states the pain makes it difficult for her to sleep at night. However she states at the end of the day her pain is also increased. She does not feel like activity throughout the day improves the muscle or joint pain to any extent. She describes the pain as "a bad bruise all over your body that someone is pressing on". She states Tylenol and some leftover tramadol she had at home have not been helpful. She states movement makes the pain worse. She states climbing stairs and getting in and out of vehicles is especially painful. She cannot name any alleviating factors other than massage and heat. Patient states she tried the gabapentin prescribed on 03/30/14 without improvement.    Xanax continues to work well for the patient's anxiety, she takes it 3 times a day as needed.      Review of Systems   Musculoskeletal: Positive for myalgias (Widespread) and arthralgias (widespread).   Psychiatric/Behavioral: The patient is nervous/anxious.    All other systems reviewed and are negative.          Objective:    Physical Exam   Constitutional: She is oriented to person, place, and time. She appears well-developed and well-nourished.   + Obese   HENT:   Head: Normocephalic and atraumatic.   Neck: Normal range of motion. Neck supple. No thyromegaly present.   Cardiovascular: Normal rate, regular rhythm and normal heart sounds.    Pulmonary/Chest: Effort normal and breath sounds normal.   Abdominal: Soft. Bowel sounds are normal. She exhibits no mass. There is no tenderness.   Musculoskeletal: She exhibits tenderness (Widespread, over all muscles and joints).   Tenderness to palpation in both upper and lower  extremities as well as throughout the spine. Joint tenderness and muscular tenderness reported with palpation. No deformity, redness, warmth, or other visible abnormality appreciated.   Neurological: She is alert and oriented to person, place, and time.   Skin: Skin is warm and dry.   Psychiatric: She has a normal mood and affect.   Patient is tearful intermittently throughout our encounter today.           Assessment:     myalgias    Arthralgia    anxiety      Plan:       Labs today to investigate this widespread pain    Prednisone taper    Rf alprazolam    We will follow up with the patient based on her results.  Further follow-up will also be dictated by lab results.

## 2014-06-17 NOTE — Progress Notes (Signed)
Reviewed and agree with treatment plan.

## 2014-06-20 ENCOUNTER — Telehealth (RURAL_HEALTH_CENTER): Payer: Self-pay

## 2014-06-20 LAB — LYME AB, TOTAL,REFLEX TO WESTERN BLOT (IGG & IGM): Lyme AB: NONREACTIVE

## 2014-06-20 LAB — ANA SCREEN ONLY: ANA Screen: NEGATIVE

## 2014-06-20 NOTE — Telephone Encounter (Signed)
Patient aware and she would like the referral to the rheumatologist.

## 2014-06-20 NOTE — Telephone Encounter (Signed)
-----   Message from Kalman Shan, NP sent at 06/20/2014  3:54 PM EST -----  All labs are normal, no evidence of autoimmune disease which is great news.  If the patient is agreeable, would like to refer her to rheumatology for further evaluation of possible fibromyalgia diagnosis and treatment options.    Thanks,  [employer]

## 2014-06-20 NOTE — Telephone Encounter (Signed)
L/m to call the office.

## 2014-06-21 ENCOUNTER — Other Ambulatory Visit (RURAL_HEALTH_CENTER): Payer: Self-pay | Admitting: Family

## 2014-06-21 DIAGNOSIS — M791 Myalgia, unspecified site: Secondary | ICD-10-CM

## 2014-06-21 NOTE — Telephone Encounter (Signed)
Referral submitted.  Pt should be hearing from our scheduling dept.

## 2014-06-28 ENCOUNTER — Ambulatory Visit (RURAL_HEALTH_CENTER): Payer: PRIVATE HEALTH INSURANCE | Admitting: Family Medicine

## 2014-06-28 ENCOUNTER — Encounter (RURAL_HEALTH_CENTER): Payer: Self-pay | Admitting: Family Medicine

## 2014-06-28 VITALS — BP 130/82 | HR 84 | Temp 98.1°F | Resp 16 | Ht 64.02 in | Wt 235.0 lb

## 2014-06-28 DIAGNOSIS — L989 Disorder of the skin and subcutaneous tissue, unspecified: Secondary | ICD-10-CM

## 2014-06-28 NOTE — Progress Notes (Signed)
Subjective:    Patient ID: Anita Aguilar is a 44 y.o. female.    HPI    Here to evaluate some moles.  They have been present for years.  The mole on her back an the one beneath her breast help.    The following portions of the patient's history were reviewed and updated as appropriate: allergies, current medications, past family history, past medical history, past social history, past surgical history and problem list.    Review of Systems   Constitutional: Negative for fever and chills.   Respiratory: Negative for shortness of breath and wheezing.    Cardiovascular: Negative for chest pain and palpitations.   Gastrointestinal: Negative for nausea, vomiting, diarrhea and constipation.   Neurological: Negative for dizziness and headaches.   All other systems reviewed and are negative.          Objective:    Physical Exam   Constitutional: She is oriented to person, place, and time. She appears well-developed and well-nourished. No distress.   HENT:   Head: Normocephalic and atraumatic.   Neck: Normal range of motion. Neck supple.   Cardiovascular: Normal rate, regular rhythm and normal heart sounds.    Pulmonary/Chest: Effort normal and breath sounds normal.   Abdominal: Soft. Bowel sounds are normal. She exhibits no distension. There is no tenderness.   Lymphadenopathy:     She has no cervical adenopathy.   Neurological: She is alert and oriented to person, place, and time. She displays normal reflexes. She exhibits normal muscle tone.   Skin: Skin is warm and dry. No rash noted.        Psychiatric: She has a normal mood and affect. Her behavior is normal.   Nursing note and vitals reviewed.          Assessment:       1. Benign skin lesion of multiple sites          Plan:       Refer derm  RTC prn

## 2014-07-07 ENCOUNTER — Emergency Department: Payer: PRIVATE HEALTH INSURANCE

## 2014-07-07 ENCOUNTER — Emergency Department
Admission: EM | Admit: 2014-07-07 | Discharge: 2014-07-07 | Disposition: A | Discharge status: Home / Self Care | Payer: PRIVATE HEALTH INSURANCE | Attending: Emergency Medicine | Admitting: Emergency Medicine

## 2014-07-07 DIAGNOSIS — B9789 Other viral agents as the cause of diseases classified elsewhere: Secondary | ICD-10-CM | POA: Insufficient documentation

## 2014-07-07 DIAGNOSIS — J069 Acute upper respiratory infection, unspecified: Secondary | ICD-10-CM | POA: Insufficient documentation

## 2014-07-07 LAB — VH INFLUENZA A/B RAPID TEST
Influenza A: NEGATIVE
Influenza B: NEGATIVE

## 2014-07-07 LAB — VH STREP A RAPID TEST: Strep A, Rapid: NEGATIVE

## 2014-07-07 NOTE — ED Provider Notes (Signed)
Physician/Midlevel provider first contact with patient: 07/07/14 1945         History     Chief Complaint   Patient presents with   . Nasal Congestion   . Cough     Patient is a 44 y.o. female presenting with URI. The history is provided by the patient. No language interpreter was used.   URI  Presenting symptoms: congestion, cough, ear pain and sore throat    Congestion:     Location:  Nasal  Ear pain:     Location:  Bilateral    Severity:  Moderate  Duration:  3 days  Timing:  Constant  Chronicity:  New  Associated symptoms: arthralgias and headaches    Associated symptoms comment:  The patient has had bilateral earaches and itchiness. She reports nasal congestion. She has had a headache rated as an 8/10 without thunderclap onset. She has had worse headaches than this in the past. She has had a scratchy throat. She has also had a little diarrhea. No abdominal pain. Questionable fever. Occasional cough. No nausea or vomiting  Risk factors: no chronic respiratory disease, no diabetes mellitus, no immunosuppression, no recent illness and no recent travel             Past Medical History   Diagnosis Date   . Anxiety        Past Surgical History   Procedure Laterality Date   . Tubal ligation     . Knee surgery     . Cholecystectomy     . Cardiac catheterization  2007       No family history on file.    Social  History   Substance Use Topics   . Smoking status: Current Every Day Smoker -- 1.00 packs/day     Types: Cigarettes   . Smokeless tobacco: Never Used   . Alcohol Use: No       .     Allergies   Allergen Reactions   . Asa [Aspirin]    . Ativan [Lorazepam]    . Codeine    . Klonopin [Clonazepam]    . Morphine    . Penicillins    . Zoloft [Sertraline]        Current/Home Medications    ALPRAZOLAM (XANAX) 0.25 MG TABLET    Take 1 tablet (0.25 mg total) by mouth 3 (three) times daily as needed.    PREDNISONE (DELTASONE) 10 MG TABLET PACK    6 tabs/day x 3 days; 4 tabs/day x 3 days; 2 tabs/day x 3 days; 1 tabs/day x 3  days        Review of Systems   HENT: Positive for congestion, ear pain and sore throat.    Respiratory: Positive for cough.    Cardiovascular: Negative for chest pain and leg swelling.   Genitourinary: Negative.    Musculoskeletal: Positive for arthralgias.   Skin: Negative.    Neurological: Positive for headaches.   Psychiatric/Behavioral: Negative.    All other systems reviewed and are negative.      Physical Exam    BP: 147/83 mmHg, Heart Rate: 96, Temp: 99.1 F (37.3 C), Resp Rate: 16, SpO2: 98 %, Weight: 104.327 kg    Physical Exam   Constitutional: She is oriented to person, place, and time. She appears well-developed and well-nourished.   HENT:   Head: Normocephalic and atraumatic.   Right Ear: External ear normal.   Left Ear: External ear normal.   Eyes: EOM are normal. Pupils  are equal, round, and reactive to light.   Neck: Neck supple.   Cardiovascular: Normal rate, regular rhythm and normal heart sounds.    Pulmonary/Chest: Effort normal and breath sounds normal.   Abdominal: Soft. There is no tenderness.   Neurological: She is alert and oriented to person, place, and time.   Skin: Skin is warm and dry.   Psychiatric: She has a normal mood and affect. Her behavior is normal.   Nursing note and vitals reviewed.        MDM and ED Course     ED Medication Orders     None        Results     Procedure Component Value Units Date/Time    Influenza A / B Rapid Test [[id number]] Collected:  07/07/14 2005    Specimen Information:  Nasal Wash Updated:  07/07/14 2030     Influenza A Negative      Influenza B Negative     Narrative:      Influenza A antigen detection tests are unable to distinquish between novel and seasonal influenza A.    A negative result for either Influenza A or B antigen does not exclude influenza virus infection. Clinical correlation required.    All positive influenza antigen tests (A or B) require placement of patient on droplet precaution isolation.    Strep A Rapid Test [[id number]]  Collected:  07/07/14 2005    Specimen Information:  Throat Updated:  07/07/14 2020     Strep A, Rapid Negative                MDM       Procedures    Clinical Impression & Disposition     Clinical Impression  Final diagnoses:   Viral URI        ED Disposition     Discharge Kathreen Cosier Shanker discharge to home/self care.    Condition at disposition: Stable             New Prescriptions    No medications on file                   Charissa Bash, MD  07/07/14 2040

## 2014-07-17 ENCOUNTER — Other Ambulatory Visit (RURAL_HEALTH_CENTER): Payer: Self-pay | Admitting: Family

## 2014-07-30 ENCOUNTER — Emergency Department
Admission: EM | Admit: 2014-07-30 | Discharge: 2014-07-30 | Disposition: A | Discharge status: Home / Self Care | Payer: PRIVATE HEALTH INSURANCE | Attending: Emergency Medicine | Admitting: Emergency Medicine

## 2014-07-30 ENCOUNTER — Emergency Department: Payer: PRIVATE HEALTH INSURANCE

## 2014-07-30 DIAGNOSIS — F419 Anxiety disorder, unspecified: Secondary | ICD-10-CM | POA: Insufficient documentation

## 2014-07-30 NOTE — Discharge Instructions (Addendum)
Treating Anxiety Disorders with Medication  An anxiety disorder can make you feel nervous or apprehensive,even without a clearreason. Certain anxiety disorders can cause intense feelings of fear or panic. You may even havephysical symptoms, such as a racing heartbeat or dizziness. If you have these feelings, you don't have to suffer anymore. Treatment to help you overcome your fears will likely include therapy (also called counseling). Medication may also be prescribed to help control your symptoms.    Medications  Certain medications may be prescribed to help control your symptoms. As a result, you may feel less anxious. You may also feel able to move forward with therapy. At first, medications and dosages may need to be adjusted to find what works best for you. Try to be patient. Tell your health care provider how a medication makes you feel. This way, you can work together to find the treatment that's best for you. Keep in mind that medications can have side effects. Talk to your provider about any side effects that are bothering you. Changing the dose or type of medication may help. Don't stop taking medication on your own because it can cause symptoms to come back.   Anti-anxiety medication:This medication eases symptoms and helps you relax. Your health care provider will explain when and how to use it. It may be prescribed for use before situations that makes you anxious. Or, you may be told to take it on a regular schedule. Anti-anxiety medication may make you feel a little sleepy or "out of it." Don't drive a car or operate machinery while on this medication, until you know how it affects you.     Antidepressant medication:This kind of medication is often used to treat anxiety, even if you aren't depressed. An antidepressant helps balance out brain chemicals. This helps keep anxiety under control. This medication is taken on a schedule. It takes a few weeks to start working. If you don't notice a  change at first, you may just need more time. But if you don't notice results after the first few weeks, tell your provider.  Keep taking medications as prescribed  Never change your dosage or stop taking your medications without talking to your health care provider first. Keep the following in mind:   Some medications must be taken on a schedule. Make this part of your daily routine. For instance, always take your pill before brushing your teeth. A pillbox can help you remember if you've taken your medication each day.   Medications are often taken for6 to 12 months. Your health care provider will then evaluate whether you need to stay on them. Many people who have also had therapy may no longer need medication to manage anxiety.   You may need to stop taking medication slowly to give your body time to adjust. When it's time to stop, your health care provider will tell you more. Remember: Never stop taking your medication without talking to your provider first.   If symptoms return, you may need to start taking medications again. This isn't your fault. It's just the nature of your anxiety disorder.     2000-2015 [employer] [address], [place] [address]. All rights reserved. This information is not intended as a substitute for professional medical care. Always follow your healthcare professional's instructions.

## 2014-07-30 NOTE — ED Provider Notes (Signed)
Physician/Midlevel provider first contact with patient: 07/30/14 1923         History     Chief Complaint   Patient presents with   . Panic Attack     HPI Comments: Pt has had stressful day, reports about 1.5 hours ago began "feeling funny," had racing heart; chest, ears, and head felt warm, tongue "feels funny in the back, like I'm biting it." She has also had stress with financial difficulty, reports her husband just got a new job, is hoping this will help. 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 She reports she has Hx of panic and anxiety attacks but says this does not feel like that. Pt had a cold last month, she still has cough since then, with urinary incontinence occasionally when she coughs. Pt reports she has loose stool after having her gallbladder out.    Patient is a 44 y.o. female presenting with mental health disorder. The history is provided by the patient. No language interpreter was used.   Mental Health Problem  Presenting symptoms comment:  Anxiety  Patient accompanied by:  Spouse  Degree of incapacity (severity):  Moderate  Onset quality:  Gradual  Duration: Since today.  Timing:  Constant  Progression:  Unchanged  Chronicity:  New  Associated symptoms: anxiety    Associated symptoms: no abdominal pain             Past Medical History   Diagnosis Date   . Anxiety        Past Surgical History   Procedure Laterality Date   . Tubal ligation     . Knee surgery     . Cholecystectomy     . Cardiac catheterization  2007       No family history on file.    Social  History   Substance Use Topics   . Smoking status: Current Every Day Smoker -- 1.00 packs/day     Types: Cigarettes   . Smokeless tobacco: Never Used   . Alcohol Use: No       .     Allergies   Allergen Reactions   . Asa [Aspirin]    . Ativan [Lorazepam]    . Codeine    . Klonopin [Clonazepam]    . Morphine    . Penicillins    . Zoloft  [Sertraline]        Home Medications     Last Medication Reconciliation Action:  Complete Osa Craver, RN 07/30/2014  7:15 PM                  ALPRAZolam (XANAX) 0.25 MG tablet     TAKE 1 TABLET 3 TIMES A DAY AS NEEDED     predniSONE (DELTASONE) 10 MG tablet pack     6 tabs/day x 3 days; 4 tabs/day x 3 days; 2 tabs/day x 3 days; 1 tabs/day x 3 days           Review of Systems   Constitutional: Negative for fever and chills.        Generally felt warm   HENT: Negative for sore throat.    Respiratory: Positive for cough.    Cardiovascular:        Pt feels her heart is racing, but does not feel irregular   Gastrointestinal: Negative for nausea, vomiting, abdominal pain and  diarrhea.   Genitourinary:        Incontinence with cough   Musculoskeletal: Positive for back pain (chronic, with degenerative disk disease in lower back).   Skin: Negative for rash.   Psychiatric/Behavioral: The patient is nervous/anxious.        Physical Exam    BP: (!) 164/100 mmHg, Heart Rate: (!) 104, Temp: 98.8 F (37.1 C), Resp Rate: 16, SpO2: 98 %, Weight: 106.595 kg    Physical Exam   Constitutional: She is oriented to person, place, and time. She appears well-developed. No distress.   Pt somewhat tearful here in the ED   HENT:   Head: Normocephalic and atraumatic.   Right Ear: External ear normal.   Left Ear: External ear normal.   Nose: Nose normal.   Mouth/Throat: Oropharynx is clear and moist.   Eyes: EOM are normal. Right eye exhibits no discharge. Left eye exhibits no discharge.   Neck: Normal range of motion. Neck supple. No JVD present.   Cardiovascular: Normal rate, regular rhythm and normal heart sounds.  Exam reveals no gallop and no friction rub.    No murmur heard.  Pulmonary/Chest: Effort normal and breath sounds normal. No respiratory distress. She has no wheezes. She has no rales. She exhibits no tenderness.   Abdominal: Soft. Bowel sounds are normal. She exhibits no distension and no mass. There is no tenderness.  There is no rebound and no guarding. No hernia.   Musculoskeletal: Normal range of motion. She exhibits no edema or tenderness.   Neurological: She is alert and oriented to person, place, and time.   Skin: Skin is warm and dry. No rash noted. She is not diaphoretic. No erythema. No pallor.   Psychiatric: She has a normal mood and affect. Her behavior is normal. Thought content normal.   Nursing note and vitals reviewed.        MDM and ED Course     ED Medication Orders     None        Radiology Results (24 Hour)     Procedure Component Value Units Date/Time    XR Chest 2 Views [[id number]] Collected:  07/30/14 1951    Order Status:  Completed Updated:  07/30/14 1953    Narrative:      Clinical History:  Cough    Examination:  XR CHEST 2 VIEWS    Comparison:  No prior pertinent studies are available for comparison at this institution.       Impression:      The cardiac silhouette is within normal limits for size. There is likely mild vascular congestion without overt edema.  There is no pneumothorax, pleural effusion, or definite consolidation. Degenerative disc disease changes are noted in  the mid to lower thoracic spine.    ReadingStation:[id number]            7:55 PM  ongoing cough since diagnosis viral respiratory infection on 2/18. May be productive at times. Repeat blood pressure at 19:38 158/79 with a pulse of 81.   8:15 PM   blood pressure 146/83. Pulse 90. Dischargeable     MDM  Number of Diagnoses or Management Options  Diagnosis management comments: The differential diagnosis includes but is not limited to depression, anxiety, psychosis, schizophrenia, drug overdose--accidental, drug overdose--intentional, suicide attempt/ ideation, situational stress, behavioral outburst, intoxication or other toxidrome, infections, metabolic disorder, thyroid problem, hypoglycemia, withdrawal, hypoxemia, electrolyte disturbance, seizure, stroke, trauma, medication noncompliance    The patient is cleared medically for  psychiatric  evaluation.    Corrie Dandy was my scribe today and assisted me with documentation only.  He was present during the questioning and physical examination of this patient and documented what he observed and was instructed to document.  This note accurately reflects work and decisions made by me.           Procedures    Clinical Impression & Disposition     Clinical Impression  Final diagnoses:   Anxiety        ED Disposition     Discharge Shamyia Grandpre Holmes discharge to home/self care.    Condition at disposition: Stable             New Prescriptions    No medications on file                   Charissa Bash, MD  07/30/14 2015

## 2014-08-23 ENCOUNTER — Other Ambulatory Visit (RURAL_HEALTH_CENTER): Payer: Self-pay | Admitting: Family

## 2014-08-26 ENCOUNTER — Other Ambulatory Visit (RURAL_HEALTH_CENTER): Payer: Self-pay

## 2014-08-29 MED ORDER — ALPRAZOLAM 0.25 MG PO TABS
0.2500 mg | ORAL_TABLET | Freq: Three times a day (TID) | ORAL | Status: DC | PRN
Start: ? — End: 2014-08-29

## 2014-09-09 ENCOUNTER — Ambulatory Visit
Admission: RE | Admit: 2014-09-09 | Discharge: 2014-09-09 | Disposition: A | Discharge status: Home / Self Care | Payer: PRIVATE HEALTH INSURANCE | Source: Ambulatory Visit | Attending: Family Medicine | Admitting: Family Medicine

## 2014-09-09 ENCOUNTER — Ambulatory Visit (RURAL_HEALTH_CENTER): Payer: PRIVATE HEALTH INSURANCE | Admitting: Family Medicine

## 2014-09-09 ENCOUNTER — Encounter (RURAL_HEALTH_CENTER): Payer: Self-pay | Admitting: Family Medicine

## 2014-09-09 VITALS — BP 140/90 | HR 88 | Temp 97.9°F | Resp 16 | Ht 64.02 in | Wt 233.0 lb

## 2014-09-09 DIAGNOSIS — M5136 Other intervertebral disc degeneration, lumbar region: Secondary | ICD-10-CM | POA: Insufficient documentation

## 2014-09-09 DIAGNOSIS — M545 Low back pain, unspecified: Secondary | ICD-10-CM

## 2014-09-09 DIAGNOSIS — M79604 Pain in right leg: Secondary | ICD-10-CM

## 2014-09-09 DIAGNOSIS — M79605 Pain in left leg: Secondary | ICD-10-CM

## 2014-09-09 MED ORDER — LIDOCAINE 5 % EX PTCH
3.0000 | MEDICATED_PATCH | Freq: Every day | CUTANEOUS | Status: DC
Start: ? — End: 2014-09-09

## 2014-09-09 NOTE — Progress Notes (Signed)
Subjective:    Patient ID: Anita Aguilar is a 44 y.o. female.    HPI  Chief Complaint   Patient presents with   . Leg Pain     burning sensation back of both legs x 3 weeks.  Patient has seen Rheum in past.     Since she was here last, she has seen rheumatology, Dr Lonn Georgia at [facility], who diagnosed her with diffuse osteoarthritis.  Pool therapy has been recommended, but she has not made an appointment yet.  She states that she has bilateral leg pain from her hips to mid-calves bilaterally.  Feels like they're on fire.  It is hard to walk.  Gabapentin and Lyrica have not helped in the past.  I do not yet have records from Dr Lonn Georgia.  She is waiting to follow-up with Dr Lonn Georgia until after she completes pool therapy.  She states that she cannot take NSAIDS due to her ASA allergy.    Review of Systems   Respiratory: Negative for shortness of breath.    Cardiovascular: Negative for chest pain.           Objective:    Physical Exam   Constitutional: She appears well-developed and well-nourished. No distress (appears comfortable sitting on chair, moving fluidly without apparent difficulty ).   Cardiovascular: Normal rate, regular rhythm and normal heart sounds.    Pulmonary/Chest: Effort normal and breath sounds normal. No respiratory distress.   Musculoskeletal:        Back:    Neurological:   Negative SLR bilaterally           Assessment:       1. Bilateral low back pain without sciatica    2. Bilateral leg pain          Plan:       Lidoderm patches  X-ray LS spine  Low threshold for pain management referral

## 2014-09-12 ENCOUNTER — Telehealth (RURAL_HEALTH_CENTER): Payer: Self-pay | Admitting: Family Medicine

## 2014-09-12 NOTE — Telephone Encounter (Signed)
-----   Message from Elenor Quinones, MD sent at 09/11/2014 11:15 PM EDT -----  X-ray confirms arthritis and back. Is the Lidoderm patch helping?

## 2014-09-12 NOTE — Telephone Encounter (Signed)
Pt aware. Unable to afford medication $174.00. Can you change this to something else?

## 2014-09-13 NOTE — Telephone Encounter (Signed)
I need records from Dr Lonn Georgia.

## 2014-09-14 NOTE — Telephone Encounter (Signed)
Pt aware.

## 2014-09-15 ENCOUNTER — Encounter (RURAL_HEALTH_CENTER): Payer: Self-pay | Admitting: Family Medicine

## 2014-09-15 DIAGNOSIS — M797 Fibromyalgia: Secondary | ICD-10-CM | POA: Insufficient documentation

## 2014-09-21 ENCOUNTER — Other Ambulatory Visit (RURAL_HEALTH_CENTER): Payer: Self-pay | Admitting: Family

## 2014-09-26 ENCOUNTER — Other Ambulatory Visit (RURAL_HEALTH_CENTER): Payer: Self-pay | Admitting: Family Medicine

## 2014-09-26 ENCOUNTER — Other Ambulatory Visit (RURAL_HEALTH_CENTER): Payer: Self-pay

## 2014-09-26 MED ORDER — ALPRAZOLAM 0.25 MG PO TABS
0.2500 mg | ORAL_TABLET | Freq: Three times a day (TID) | ORAL | Status: DC | PRN
Start: ? — End: 2014-09-26

## 2014-10-24 ENCOUNTER — Emergency Department: Payer: PRIVATE HEALTH INSURANCE

## 2014-10-24 ENCOUNTER — Emergency Department
Admission: EM | Admit: 2014-10-24 | Discharge: 2014-10-24 | Disposition: A | Discharge status: Home / Self Care | Payer: PRIVATE HEALTH INSURANCE | Attending: Emergency Medicine | Admitting: Emergency Medicine

## 2014-10-24 DIAGNOSIS — R42 Dizziness and giddiness: Secondary | ICD-10-CM | POA: Insufficient documentation

## 2014-10-24 DIAGNOSIS — R110 Nausea: Secondary | ICD-10-CM | POA: Insufficient documentation

## 2014-10-24 LAB — CBC AND DIFFERENTIAL
Bands: 5 % (ref 0–10)
Basophils %: 0 % (ref 0.0–3.0)
Basophils Absolute: 0 10*3/uL (ref 0.0–0.3)
Eosinophils %: 1 % (ref 0.0–7.0)
Eosinophils Absolute: 0.1 10*3/uL (ref 0.0–0.8)
Hematocrit: 49.7 % — ABNORMAL HIGH (ref 36.0–48.0)
Hemoglobin: 16.4 gm/dL — ABNORMAL HIGH (ref 12.0–16.0)
Lymphocytes Absolute: 1.6 10*3/uL (ref 0.6–5.1)
Lymphocytes: 17 % (ref 15.0–46.0)
MCH: 30 pg (ref 28–35)
MCHC: 33 gm/dL (ref 31–36)
MCV: 91 fL (ref 80–100)
MPV: 10.3 fL — ABNORMAL HIGH (ref 6.0–10.0)
Monocytes Absolute: 0.3 10*3/uL (ref 0.1–1.7)
Monocytes: 3 % (ref 3.0–15.0)
Neutrophils %: 74 % (ref 42.0–78.0)
Neutrophils Absolute: 7.3 10*3/uL (ref 1.7–8.6)
PLT CT: 277 10*3/uL (ref 130–440)
RBC Morphology: ADEQUATE
RBC: 5.45 10*6/uL — ABNORMAL HIGH (ref 3.80–5.00)
RDW: 11.9 % (ref 10.5–14.5)
WBC: 9.3 10*3/uL (ref 4.00–11.00)

## 2014-10-24 LAB — VH URINALYSIS WITH MICROSCOPIC AND CULTURE IF INDICATED
Bilirubin, UA: NEGATIVE mg/dL
Blood, UA: NEGATIVE mg/dL
Glucose, UA: NEGATIVE mg/dL
Ketones UA: NEGATIVE mg/dL
Leukocyte Esterase, UA: NEGATIVE Leu/uL
Nitrite, UA: NEGATIVE
Protein, UR: NEGATIVE mg/dL
Urine Specific Gravity: 1.006 (ref 1.001–1.040)
Urobilinogen, UA: 0.2 mg/dL
pH, Urine: 6.5 pH (ref 5.0–8.0)

## 2014-10-24 LAB — COMPREHENSIVE METABOLIC PANEL
ALT: 27 U/L (ref 0–55)
AST (SGOT): 16 U/L (ref 10–42)
Albumin/Globulin Ratio: 1.13 Ratio (ref 0.70–1.50)
Albumin: 3.7 gm/dL (ref 3.5–5.0)
Alkaline Phosphatase: 63 U/L (ref 40–145)
Anion Gap: 15.3 mMol/L (ref 7.0–18.0)
BUN / Creatinine Ratio: 10.5 Ratio (ref 10.0–30.0)
BUN: 6 mg/dL — ABNORMAL LOW (ref 7–22)
Bilirubin, Total: 0.4 mg/dL (ref 0.1–1.2)
CO2: 24 mMol/L (ref 20.0–30.0)
Calcium: 8.8 mg/dL (ref 8.5–10.5)
Chloride: 104 mMol/L (ref 98–110)
Creatinine: 0.57 mg/dL — ABNORMAL LOW (ref 0.60–1.20)
EGFR: >60 mL/min/{1.73_m2}
Globulin: 3.3 gm/dL (ref 2.0–4.0)
Glucose: 129 mg/dL — ABNORMAL HIGH (ref 70–99)
Osmolality Calc: 278 mOsm/kg (ref 275–300)
Potassium: 4 mMol/L (ref 3.5–5.3)
Protein, Total: 6.9 gm/dL (ref 6.0–8.3)
Sodium: 139 mMol/L (ref 136–147)

## 2014-10-24 MED ORDER — ONDANSETRON HCL 4 MG/2ML IJ SOLN
INTRAMUSCULAR | Status: AC
Start: 2014-10-24 — End: ?
  Filled 2014-10-24: qty 2

## 2014-10-24 MED ORDER — MECLIZINE HCL 25 MG PO TABS
ORAL_TABLET | ORAL | Status: AC
Start: 2014-10-24 — End: ?
  Filled 2014-10-24: qty 1

## 2014-10-24 MED ORDER — MECLIZINE HCL 25 MG PO TABS
25.0000 mg | ORAL_TABLET | Freq: Three times a day (TID) | ORAL | Status: DC | PRN
Start: 2014-10-24 — End: 2014-10-24
  Administered 2014-10-24: 25 mg via ORAL

## 2014-10-24 MED ORDER — MECLIZINE HCL 25 MG PO TABS
25.0000 mg | ORAL_TABLET | Freq: Four times a day (QID) | ORAL | Status: DC | PRN
Start: 2014-10-24 — End: 2015-03-02

## 2014-10-24 MED ORDER — SODIUM CHLORIDE 0.9 % IV BOLUS
1000.0000 mL | Freq: Once | INTRAVENOUS | Status: AC
Start: 2014-10-24 — End: 2014-10-24
  Administered 2014-10-24: 1000 mL via INTRAVENOUS

## 2014-10-24 MED ORDER — ONDANSETRON HCL 4 MG/2ML IJ SOLN
4.0000 mg | Freq: Once | INTRAMUSCULAR | Status: AC
Start: 2014-10-24 — End: 2014-10-24
  Administered 2014-10-24: 4 mg via INTRAVENOUS

## 2014-10-24 NOTE — ED Provider Notes (Signed)
Physician/Midlevel provider first contact with patient: 10/24/14 5409         History     Chief Complaint   Patient presents with   . Dizziness   . Nausea     HPI     For the last 7-10 days she's had intermittent right-sided facial numbness and tingling. For the last 2 days the numbness and tingling has been constant and unrelenting. In addition to her face, she is also numb on the right side of her tongue. Also, for the last 2 days she's had vertigo particularly if she turns her head to the left. Things got particularly bad overnight because she normally sleeps on her left side but every time she tried to lay on her left side, the room started spinning and she began to vomit and it was extremely uncomfortable. She denies a headache. She denies diplopia. She's never had anything like this before.    Past Medical History   Diagnosis Date   . Anxiety    . Fibromyalgia        Past Surgical History   Procedure Laterality Date   . Tubal ligation     . Knee surgery     . Cholecystectomy     . Cardiac catheterization  2007       History reviewed. No pertinent family history.    Social  History   Substance Use Topics   . Smoking status: Current Every Day Smoker -- 1.00 packs/day     Types: Cigarettes   . Smokeless tobacco: Never Used   . Alcohol Use: No       .     Allergies   Allergen Reactions   . Asa [Aspirin] Anaphylaxis   . Ativan [Lorazepam]    . Codeine    . Klonopin [Clonazepam]    . Morphine    . Penicillins    . Zoloft [Sertraline]        Home Medications                   ALPRAZolam (XANAX) 0.25 MG tablet     Take 1 tablet (0.25 mg total) by mouth 3 (three) times daily as needed.                     Review of Systems   Constitutional: Negative for fever and chills.   Eyes: Negative for visual disturbance.   Respiratory: Negative for shortness of breath.    Cardiovascular: Negative for chest pain and palpitations.   Neurological: Positive for dizziness. Negative for headaches.       Physical Exam    BP: (!)  171/107 mmHg, Heart Rate: 98, Temp: 98.9 F (37.2 C), Resp Rate: 16, SpO2: 97 %, Weight: 105.688 kg    Physical Exam   Constitutional: She is oriented to person, place, and time. She appears well-developed and well-nourished. No distress.   HENT:   Head: Normocephalic and atraumatic.   Eyes: EOM are normal. Pupils are equal, round, and reactive to light.   No nystagmus   Neck: Normal range of motion. Neck supple.   Cardiovascular: Normal rate, regular rhythm and normal heart sounds.    Pulmonary/Chest: Effort normal and breath sounds normal.   Abdominal: Soft. Bowel sounds are normal. She exhibits no distension. There is no tenderness.   Lymphadenopathy:     She has no cervical adenopathy.   Neurological: She is alert and oriented to person, place, and time. She displays normal reflexes.  She exhibits normal muscle tone.   Skin: Skin is warm and dry. No rash noted.   Psychiatric: Her behavior is normal. Her mood appears anxious.   Nursing note and vitals reviewed.        MDM and ED Course     ED Medication Orders     Start Ordered     Status Ordering Provider    10/24/14 0831 10/24/14 0830  sodium chloride 0.9 % bolus 1,000 mL   Once in ED     Route: Intravenous  Ordered Dose: 1,000 mL     Last MAR action:  New Bag LEE, ROLAND DAVID    10/24/14 0831 10/24/14 0830  ondansetron (ZOFRAN) injection 4 mg   Once in ED     Route: Intravenous  Ordered Dose: 4 mg     Last MAR action:  Given LEE, ROLAND DAVID    10/24/14 0830 10/24/14 0830  meclizine (ANTIVERT) tablet 25 mg   3 times daily PRN     Route: Oral  Ordered Dose: 25 mg     Last MAR action:  Given LEE, ROLAND DAVID           Labs     Results     Procedure Component Value Units Date/Time    Urinalysis w Microscopic and Culture if Indicated [[id number]] Collected:  10/24/14 0925    Specimen Information:  Urine, Random Updated:  10/24/14 0931     Color, UA Yellow      Clarity, UA Clear      Specific Gravity, UR 1.006      pH, Urine 6.5 pH      Protein, UR Negative mg/dL       Glucose, UA Negative mg/dL      Ketones UA Negative mg/dL      Bilirubin, UA Negative mg/dL      Blood, UA Negative mg/dL      Nitrite, UA Negative      Urobilinogen, UA 0.2 mg/dL      Leukocyte Esterase, UA Negative Leu/uL     Comprehensive metabolic panel [[id number]]  (Abnormal) Collected:  10/24/14 0855    Specimen Information:  Plasma Updated:  10/24/14 0930     Sodium 139 mMol/L      Potassium 4.0 mMol/L      Chloride 104 mMol/L      CO2 24.0 mMol/L      Calcium 8.8 mg/dL      Glucose 361 (H) mg/dL      Creatinine 4.43 (L) mg/dL      BUN 6 (L) mg/dL      Protein, Total 6.9 gm/dL      Albumin 3.7 gm/dL      Alkaline Phosphatase 63 U/L      ALT 27 U/L      AST (SGOT) 16 U/L      Bilirubin, Total 0.4 mg/dL      Albumin/Globulin Ratio 1.13 Ratio      Anion Gap 15.3 mMol/L      BUN/Creatinine Ratio 10.5 Ratio      EGFR >60 mL/min/1.29m2      Osmolality Calc 278 mOsm/kg      Globulin 3.3 gm/dL     CBC and differential [[id number]]  (Abnormal) Collected:  10/24/14 0855    Specimen Information:  Blood from Blood Updated:  10/24/14 0929     WBC 9.3 K/cmm      RBC 5.45 (H) M/cmm      Hemoglobin 16.4 (H) gm/dL  Hematocrit 49.7 (H) %      MCV 91 fL      MCH 30 pg      MCHC 33 gm/dL      RDW 60.4 %      PLT CT 277 K/cmm      MPV 10.3 (H) fL      NEUTROPHIL % 74.0 %      Lymphocytes 17.0 %      Monocytes 3.0 %      Eosinophils % 1.0 %      Basophils % 0.0 %      Bands 5 %      Neutrophils Absolute 7.3 K/cmm      Lymphocytes Absolute 1.6 K/cmm      Monocytes Absolute 0.3 K/cmm      Eosinophils Absolute 0.1 K/cmm      BASO Absolute 0.0 K/cmm      RBC Morphology        Result:        Morphology Consistent with Hemogram Platelets Adequate    Narrative:      Manual differential performed              Radiologic Studies  Radiology Results (24 Hour)     Procedure Component Value Units Date/Time    CT Head WO Contrast [[id number]] Collected:  10/24/14 0900    Order Status:  Completed Updated:  10/24/14 0915    Narrative:       Clinical History:  44 year old female. Right-sided facial numbness and vertigo.    Examination:  CT head without intravenous contrast.    Technique:  5 mm helical images obtained from the skull base to the vertex with and without IV contrast. Sagittal and coronal  reformatted images provided.    CT images were acquired utilizing Automated Exposure Control for dose reduction.     Comparison:  None available.    Findings:  No intracranial hemorrhage or mass effect.    Normal differentiation of the gray and white matter.    Normal size of the ventricles, cisterns, and sulci.    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 This is  of doubtful clinical significance. Mastoid air cells and middle ears clear.    Visualized paranasal sinuses clear.    Bilateral proptosis with prominence of the retro-orbital adipose tissue. Normal size of the extraocular muscles.      Impression:      1.  Intracranial contents normal.  2.  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  3.  Bilateral proptosis.    ReadingStation:[id number]      .      EKG Interpretation:    Rhythm:  Normal Sinus  Ectopy:  None  Rate:  Normal  Conduction:  No blocks  ST Segments:  Normal ST segments  T Waves:  Normal T Waves  Axis:  Normal  Other findings:  Low voltage  Q Waves:  None seen  Pacing:  Not applicable  Clinical Impression:  Normal EKG      MDM  Number of Diagnoses or Management Options  Vertigo:      Amount and/or Complexity of Data Reviewed  Clinical lab tests: ordered and reviewed  Tests in the radiology section of CPT: ordered and reviewed    Risk of Complications, Morbidity, and/or Mortality  Presenting problems: moderate  Diagnostic procedures: moderate  Management options:  moderate  General comments: 9:51 AM  Care assigned to me by Dr Nedra Hai.  Pt with some improvement with vertigo.  Tests and labs reviewed and  discussed with pt.  To discharge and f/u as outpt.     Patient Progress  Patient progress: improved            Procedures    Clinical Impression & Disposition     Clinical Impression  Final diagnoses:   Vertigo        ED Disposition     Discharge Hildegard Hlavac Wieting discharge to home/self care.    Condition at disposition: Stable             New Prescriptions    MECLIZINE (ANTIVERT) 25 MG TABLET    Take 1 tablet (25 mg total) by mouth every 6 (six) hours as needed for Dizziness.                   Chucky May, MD  10/24/14 [phone]

## 2014-10-24 NOTE — Discharge Instructions (Signed)
Dizziness [Uncertain Cause]  Dizziness is a common symptom sometimes described as "lightheadedness" or feeling like you are going to faint. If it lasts for only a few seconds and is related to changes in position (such as getting up after lying or sitting for a long time), it is usually not a sign of anything serious. Dizziness that lasts for minutes to hours, or comes on for no apparent reason, may be a sign of a more serious problem (such as dehydration, a medicine reaction, disease of the heart or brain).  Today's exam did not show an exact cause for your dizzy spell . Sometimes additional tests are required before a cause can be found. Therefore, it is important to follow up with your doctor if your symptoms continue.  Home Care:  1) If a dizzy spell occurs and lasts more than a few seconds, lie down until it passes. If you are lying down, then you cannot hurt yourself by falling if you do faint.  2) Do not drive or operate dangerous equipment until the dizzy spells have stopped for at least 48 hours.  3) If dizzy spells occur with sudden standing, this may be a sign of mild dehydration. Drink extra fluids over the next few days.  4) If you recently started a new medicine or if you had the dose of a current medicine increased (especially blood pressure medicine), talk with the prescribing doctor about your symptoms. Dose adjustments may be needed.  Follow Up  with your doctor for further evaluation within the next seven days, if your symptoms continue.  Get Prompt Medical Attention  if any of the following occur:  -- Worsening of your symptoms  -- Fainting, headache or seizure  -- Repeated vomiting  -- Feeling like you or the room is spinning  -- Chest, arm, neck, back or jaw pain  -- Palpitations (the sense that your heart is fluttering or beating fast or hard)  -- Shortness of breath  -- Blood in vomit or stool (black or red color)  -- Weakness of an arm or leg or one side of the face  -- Difficulty with  speech or vision   2000-2015 [employer] [address], [place] [address]. All rights reserved. This information is not intended as a substitute for professional medical care. Always follow your healthcare professional's instructions.

## 2014-10-28 ENCOUNTER — Other Ambulatory Visit (RURAL_HEALTH_CENTER): Payer: Self-pay

## 2014-10-28 MED ORDER — ALPRAZOLAM 0.25 MG PO TABS
0.2500 mg | ORAL_TABLET | Freq: Three times a day (TID) | ORAL | Status: DC | PRN
Start: ? — End: 2014-10-28

## 2014-11-01 DIAGNOSIS — R42 Dizziness and giddiness: Secondary | ICD-10-CM | POA: Insufficient documentation

## 2014-11-03 ENCOUNTER — Ambulatory Visit (RURAL_HEALTH_CENTER): Payer: PRIVATE HEALTH INSURANCE | Admitting: Family Medicine

## 2014-11-07 ENCOUNTER — Other Ambulatory Visit
Admission: RE | Admit: 2014-11-07 | Discharge: 2014-11-07 | Disposition: A | Discharge status: Home / Self Care | Payer: PRIVATE HEALTH INSURANCE | Source: Ambulatory Visit | Attending: Family Medicine | Admitting: Family Medicine

## 2014-11-07 ENCOUNTER — Encounter (RURAL_HEALTH_CENTER): Payer: Self-pay | Admitting: Family Medicine

## 2014-11-07 ENCOUNTER — Ambulatory Visit (RURAL_HEALTH_CENTER): Payer: PRIVATE HEALTH INSURANCE | Admitting: Family Medicine

## 2014-11-07 VITALS — BP 160/88 | HR 80 | Temp 97.6°F | Resp 20 | Ht 64.02 in | Wt 237.0 lb

## 2014-11-07 DIAGNOSIS — R42 Dizziness and giddiness: Secondary | ICD-10-CM

## 2014-11-07 DIAGNOSIS — N951 Menopausal and female climacteric states: Secondary | ICD-10-CM

## 2014-11-07 LAB — CBC AND DIFFERENTIAL
Basophils %: 0.1 % (ref 0.0–3.0)
Basophils Absolute: 0 10*3/uL (ref 0.0–0.3)
Eosinophils %: 1.4 % (ref 0.0–7.0)
Eosinophils Absolute: 0.1 10*3/uL (ref 0.0–0.8)
Hematocrit: 47.6 % (ref 36.0–48.0)
Hemoglobin: 15.8 gm/dL (ref 12.0–16.0)
Lymphocytes Absolute: 2.1 10*3/uL (ref 0.6–5.1)
Lymphocytes: 26.7 % (ref 15.0–46.0)
MCH: 30 pg (ref 28–35)
MCHC: 33 gm/dL (ref 32–36)
MCV: 91 fL (ref 80–100)
MPV: 8.7 fL (ref 6.0–10.0)
Monocytes Absolute: 0.5 10*3/uL (ref 0.1–1.7)
Monocytes: 6.5 % (ref 3.0–15.0)
Neutrophils %: 65.2 % (ref 42.0–78.0)
Neutrophils Absolute: 5.2 10*3/uL (ref 1.7–8.6)
PLT CT: 276 10*3/uL (ref 130–440)
RBC: 5.22 10*6/uL — ABNORMAL HIGH (ref 3.80–5.00)
RDW: 12 % (ref 11.0–14.0)
WBC: 7.9 10*3/uL (ref 4.0–11.0)

## 2014-11-07 LAB — COMPREHENSIVE METABOLIC PANEL
ALT: 20 U/L (ref 0–55)
AST (SGOT): 15 U/L (ref 10–42)
Albumin/Globulin Ratio: 1.19 Ratio (ref 0.70–1.50)
Albumin: 3.8 gm/dL (ref 3.5–5.0)
Alkaline Phosphatase: 67 U/L (ref 40–145)
Anion Gap: 14.7 mMol/L (ref 7.0–18.0)
BUN / Creatinine Ratio: 8.6 Ratio — ABNORMAL LOW (ref 10.0–30.0)
BUN: 5 mg/dL — ABNORMAL LOW (ref 7–22)
Bilirubin, Total: 0.7 mg/dL (ref 0.1–1.2)
CO2: 22.3 mMol/L (ref 20.0–30.0)
Calcium: 8.9 mg/dL (ref 8.5–10.5)
Chloride: 105 mMol/L (ref 98–110)
Creatinine: 0.58 mg/dL — ABNORMAL LOW (ref 0.60–1.20)
EGFR: >60 mL/min/{1.73_m2}
Globulin: 3.2 gm/dL (ref 2.0–4.0)
Glucose: 97 mg/dL (ref 70–99)
Osmolality Calc: 273 mOsm/kg — ABNORMAL LOW (ref 275–300)
Potassium: 4 mMol/L (ref 3.5–5.3)
Protein, Total: 7 gm/dL (ref 6.0–8.3)
Sodium: 138 mMol/L (ref 136–147)

## 2014-11-07 LAB — TSH: TSH: 1.36 u[IU]/mL (ref 0.40–4.20)

## 2014-11-07 LAB — FOLLICLE STIMULATING HORMONE: Follicle Stimulating Hormone: 7.5 m[IU]/mL

## 2014-11-07 LAB — LUTEINIZING HORMONE: LH: 1.73 m[IU]/mL

## 2014-11-07 NOTE — Progress Notes (Signed)
Subjective:    Patient ID: Anita Aguilar is a 44 y.o. female.    HPI  Chief Complaint   Patient presents with   . Dizziness     not as bad, but still having trouble     Here to follow-up an ER visit on 6/6, initially seen by me before I signed out to Dr Freida Busman at the end of my shift.  Her final diagnosis was vertigo.  She still feels spinning if she turns her head too fast, but she is much better than she was.  No new complaints.      She does report hot flashes and irregular, frequent periods over the last couple months and she would like her hormones checked.  Although her TSH was normal back in November her CT in the ER revealed exophthalmos so I will recheck a TSH today as well.    Review of Systems   Constitutional: Negative for fever and chills.   Respiratory: Negative for shortness of breath and wheezing.    Cardiovascular: Negative for chest pain and palpitations.   Gastrointestinal: Negative for nausea, vomiting, diarrhea and constipation.   Neurological: Negative for dizziness and headaches.   All other systems reviewed and are negative.          Objective:    Physical Exam   Constitutional: She is oriented to person, place, and time. She appears well-developed and well-nourished. No distress.   HENT:   Head: Normocephalic and atraumatic.   Neck: Normal range of motion. Neck supple.   Cardiovascular: Normal rate, regular rhythm and normal heart sounds.    Pulmonary/Chest: Effort normal and breath sounds normal.   Abdominal: Soft. Bowel sounds are normal. She exhibits no distension. There is no tenderness.   Lymphadenopathy:     She has no cervical adenopathy.   Neurological: She is alert and oriented to person, place, and time. She displays normal reflexes. She exhibits normal muscle tone.   Skin: Skin is warm and dry. No rash noted.   Psychiatric: She has a normal mood and affect. Her behavior is normal.   Nursing note and vitals reviewed.          Assessment:       1. Vertigo    2. Menopausal symptom           Plan:       Continue Antivert as needed and let me know if symptoms persist  Labs including TSH, LH, and FSH  RTC prn

## 2014-11-07 NOTE — Progress Notes (Signed)
Date Specimen Drawn:  11/07/2014   Time Specimen Drawn:  11:14 AM   Test(s) Ordered:  Tsh, cmp, LH, FSH, cbc   Disposition:  n/a   Patient's Tolerance:  Good   Location Specimen Drawn:  Left antecubital

## 2014-11-08 ENCOUNTER — Telehealth (RURAL_HEALTH_CENTER): Payer: Self-pay

## 2014-11-08 DIAGNOSIS — N921 Excessive and frequent menstruation with irregular cycle: Secondary | ICD-10-CM

## 2014-11-08 NOTE — Telephone Encounter (Signed)
Done

## 2014-11-08 NOTE — Telephone Encounter (Signed)
-----   Message from Elenor Quinones, MD sent at 11/08/2014  9:44 AM EDT -----  Labs look good; no evidence of menopause. Would she like to be referred to a gynecologist for her irregular periods?

## 2014-11-08 NOTE — Telephone Encounter (Signed)
Pt aware, interested in a referral to gyn at [facility].

## 2014-11-08 NOTE — Telephone Encounter (Signed)
L/m to call the office.

## 2014-11-28 ENCOUNTER — Other Ambulatory Visit (RURAL_HEALTH_CENTER): Payer: Self-pay | Admitting: Family Medicine

## 2014-12-01 ENCOUNTER — Other Ambulatory Visit (RURAL_HEALTH_CENTER): Payer: Self-pay | Admitting: Family

## 2014-12-06 NOTE — Telephone Encounter (Signed)
Pt aware of refill too early

## 2014-12-14 ENCOUNTER — Ambulatory Visit (INDEPENDENT_AMBULATORY_CARE_PROVIDER_SITE_OTHER): Payer: PRIVATE HEALTH INSURANCE | Admitting: Obstetrics

## 2015-01-10 ENCOUNTER — Other Ambulatory Visit (RURAL_HEALTH_CENTER): Payer: Self-pay | Admitting: Family Medicine

## 2015-01-17 ENCOUNTER — Emergency Department
Admission: EM | Admit: 2015-01-17 | Discharge: 2015-01-17 | Disposition: A | Discharge status: Home / Self Care | Payer: PRIVATE HEALTH INSURANCE | Attending: General Practice | Admitting: General Practice

## 2015-01-17 ENCOUNTER — Emergency Department: Payer: PRIVATE HEALTH INSURANCE

## 2015-01-17 DIAGNOSIS — M25561 Pain in right knee: Secondary | ICD-10-CM

## 2015-01-17 DIAGNOSIS — M25461 Effusion, right knee: Secondary | ICD-10-CM | POA: Insufficient documentation

## 2015-01-17 DIAGNOSIS — M25469 Effusion, unspecified knee: Secondary | ICD-10-CM

## 2015-01-17 MED ORDER — NAPROXEN 250 MG PO TABS
250.0000 mg | ORAL_TABLET | Freq: Two times a day (BID) | ORAL | Status: DC
Start: 2015-01-17 — End: 2015-02-05

## 2015-01-17 NOTE — ED Provider Notes (Signed)
Physician/Midlevel provider first contact with patient: 01/17/15 0940         History     Chief Complaint   Patient presents with   . Knee Pain     RT   . Leg Swelling     RT     HPI   44 year old female came to emergency room complaining of right knee pain. Patient has had this problem for several year. For the past 2 weeks the patient is getting worse. Patient had right knee surgery about 10 years ago in [place]. Patient is working as a Engineer, structural. Patient has history of anxiety and fibromyalgia.      Past Medical History   Diagnosis Date   . Anxiety    . Fibromyalgia        Past Surgical History   Procedure Laterality Date   . Tubal ligation     . Knee surgery     . Cholecystectomy     . Cardiac catheterization  2007       No family history on file.    Social  Social History   Substance Use Topics   . Smoking status: Current Every Day Smoker -- 1.00 packs/day     Types: Cigarettes   . Smokeless tobacco: Never Used   . Alcohol Use: No       .     Allergies   Allergen Reactions   . Asa [Aspirin] Anaphylaxis   . Ativan [Lorazepam]    . Codeine    . Klonopin [Clonazepam]    . Morphine    . Penicillins    . Zoloft [Sertraline]        Home Medications     Last Medication Reconciliation Action:  Complete Marjo Bicker, RN 01/17/2015  9:36 AM                  ALPRAZolam (XANAX) 0.25 MG tablet     TAKE 1 TABLET 3 TIMES A DAY AS NEEDED     meclizine (ANTIVERT) 25 MG tablet     Take 1 tablet (25 mg total) by mouth every 6 (six) hours as needed for Dizziness.           Review of Systems   All other systems reviewed and are negative.      Physical Exam    BP: (!) 151/92 mmHg, Heart Rate: 90, Temp: 98.4 F (36.9 C), Resp Rate: 16, SpO2: 99 %, Weight: 106.595 kg    Physical Exam   Constitutional: She is oriented to person, place, and time. She appears well-developed and well-nourished. No distress.   HENT:   Head: Normocephalic and atraumatic.   Right Ear: External ear normal.   Left Ear: External ear normal.   Nose:  Nose normal.   Mouth/Throat: Oropharynx is clear and moist. No oropharyngeal exudate.   Neck: Normal range of motion. Neck supple. No JVD present. No tracheal deviation present. No thyromegaly present.   Cardiovascular: Normal rate, regular rhythm and normal heart sounds.    Abdominal: Soft. Bowel sounds are normal. She exhibits no distension and no mass. There is no tenderness. There is no rebound and no guarding. No hernia.   Musculoskeletal: She exhibits edema and tenderness.   Tender on movement tender slightly edematous medial aspect of right knee no deformity no limitation of movement. Neurovascular intact.   Lymphadenopathy:     She has no cervical adenopathy.   Neurological: She is alert and oriented to person, place, and time.  Skin: Skin is warm. No rash noted. She is not diaphoretic. No erythema. No pallor.   Nursing note and vitals reviewed.        MDM and ED Course     ED Medication Orders     None             MDM  Reviewed x-ray with patient and patient was advised to keep the Ace wrap, Naprosyn 250 mg for pain and inflammation see orthopedic surgeon for follow-up.        Procedures    Clinical Impression & Disposition     Clinical Impression  Final diagnoses:   Right knee pain   Joint effusion of knee        ED Disposition     Discharge Kathreen Cosier Sweetin discharge to home/self care.    Condition at disposition: Stable             Discharge Medication List as of 01/17/2015 11:28 AM      START taking these medications    Details   naproxen (NAPROSYN) 250 MG tablet Take 1 tablet (250 mg total) by mouth 2 (two) times daily with meals., Starting 01/17/2015, Until Discontinued, Normal                         Zyah Gomm, Jean Rosenthal, MD  01/25/15 1427

## 2015-01-17 NOTE — ED Notes (Signed)
RT KNEE PAIN FOR SEVERAL WEEKS, DENIES INJURY. PT STATES A PULLING SENSATION TO BACK AND INNER SIDE OF KNEE W/ SWELLING. H/O RT KNEE SURG 10 YEARS AGO. PT STATES SHE DOES USE STAIRS ALOT

## 2015-01-17 NOTE — Discharge Instructions (Signed)
Knee Pain  Knee pain is very common. It's especially common in active people who put a lot of pressure on their knees, like runners. It affects women more often than men.  Your kneecap (patella) is a thick, round bone. It covers and protects the front portion of your knee joint. It moves along a groove in your thighbone (femur) as part of the patellofemoral joint. A layer of cartilage surrounds the underside of your kneecap. This layer protects it from grinding against your femur.  When this cartilage softens and breaks down, it can cause knee pain. This is partly because of repetitive stress. The stress irritates the lining of the joint. This causes pain in the underlying bone.  What causes knee pain?  Many things can cause knee pain. You may have more than one cause. Some of these include:   Overuse of the knee joint   The kneecap doesn't line up with the tissue around it   Damage to small nerves in the area   Damage to the ligament-like structure that holds the kneecap in place (retinaculum)   Breakdown of the bone under the cartilage   Swelling in the soft tissues around the kneecap   Injury  You might be more likely to have knee pain if you:   Exercise a lot   Recently increased the intensity of your workouts   Have a body mass index (BMI) greater than 25   Have poor alignment of your kneecap   Walk with your feet turned overly outward or inward   Have weakness in surrounding muscle groups (inner quad or hip adductor muscles)   Have too much tightness in surrounding muscle groups (hamstrings or iliotibial band)   Have a recent history of injury to the area   Are female  Symptoms of knee pain  This type of knee pain is a dull, aching pain in the front of the knee in the area under and around the kneecap. This pain may start quickly or slowly. Your pain might be worse when you squat, run, or sit for a long time. You might also sometimes feel like your knee is giving out. You may have symptoms in  one or both of your knees.  Diagnosing knee pain  Your health care provider will ask about your medical history and your symptoms. Be sure to describe any activities that make your knee pain worse. He or she will look at your knee. This will include tests of your range of motion, strength, and areas of pain of your knee. Your knee alignment will be checked.  Your health care provider will need to rule out other causes of your knee pain, such as arthritis. You may need an imaging test, such as an X-ray or MRI.  Treatment for knee pain  Treatments that can help ease your symptoms may include:   Avoiding activities for a while that make your pain worse, returning to activity over time   Icing the outside of your knee when it causes you pain   Taking over-the-counter pain medicine   Wearing a knee brace or taping your knee to support it   Wearing special shoe inserts to help keep your feet in the proper alignment   Doing special exercises to stretch and strengthen the muscles around your hip and your knee  These steps help most people manage knee pain. But some cases of knee pain need to be treated with surgery. You may need surgery right away. Or you may   need it later if other treatments don't work. Your health care provider may refer you to an orthopedic surgeon. He or she will talk with you about your choices.  Preventing knee pain  Losing weight and correcting excess muscle tightness or muscle weakness may help lower your risk.  In some cases, you can prevent knee pain. To help prevent a flare-up of knee pain, you do these things:   Regularly do all the exercises your doctor or physical therapist advises   Support your knee as advised by your doctor or physical therapist   Increase training gradually, and ease up on training when needed   Have an expert check your gait for running or other sporting activities   Stretch properly before and after exercise   Replace your running shoes regularly   Lose  excess weight          2000-2015 [employer] [address], [place] [address]. All rights reserved. This information is not intended as a substitute for professional medical care. Always follow your healthcare professional's instructions.        Knee Pain  Knee pain is very common. It's especially common in active people who put a lot of pressure on their knees, like runners. It affects women more often than men.  Your kneecap (patella) is a thick, round bone. It covers and protects the front portion of your knee joint. It moves along a groove in your thighbone (femur) as part of the patellofemoral joint. A layer of cartilage surrounds the underside of your kneecap. This layer protects it from grinding against your femur.  When this cartilage softens and breaks down, it can cause knee pain. This is partly because of repetitive stress. The stress irritates the lining of the joint. This causes pain in the underlying bone.  What causes knee pain?  Many things can cause knee pain. You may have more than one cause. Some of these include:   Overuse of the knee joint   The kneecap doesn't line up with the tissue around it   Damage to small nerves in the area   Damage to the ligament-like structure that holds the kneecap in place (retinaculum)   Breakdown of the bone under the cartilage   Swelling in the soft tissues around the kneecap   Injury  You might be more likely to have knee pain if you:   Exercise a lot   Recently increased the intensity of your workouts   Have a body mass index (BMI) greater than 25   Have poor alignment of your kneecap   Walk with your feet turned overly outward or inward   Have weakness in surrounding muscle groups (inner quad or hip adductor muscles)   Have too much tightness in surrounding muscle groups (hamstrings or iliotibial band)   Have a recent history of injury to the area   Are female  Symptoms of knee pain  This type of knee pain is a dull,  aching pain in the front of the knee in the area under and around the kneecap. This pain may start quickly or slowly. Your pain might be worse when you squat, run, or sit for a long time. You might also sometimes feel like your knee is giving out. You may have symptoms in one or both of your knees.  Diagnosing knee pain  Your health care provider will ask about your medical history and your symptoms. Be sure to describe any activities that make   your knee pain worse. He or she will look at your knee. This will include tests of your range of motion, strength, and areas of pain of your knee. Your knee alignment will be checked.  Your health care provider will need to rule out other causes of your knee pain, such as arthritis. You may need an imaging test, such as an X-ray or MRI.  Treatment for knee pain  Treatments that can help ease your symptoms may include:   Avoiding activities for a while that make your pain worse, returning to activity over time   Icing the outside of your knee when it causes you pain   Taking over-the-counter pain medicine   Wearing a knee brace or taping your knee to support it   Wearing special shoe inserts to help keep your feet in the proper alignment   Doing special exercises to stretch and strengthen the muscles around your hip and your knee  These steps help most people manage knee pain. But some cases of knee pain need to be treated with surgery. You may need surgery right away. Or you may need it later if other treatments don't work. Your health care provider may refer you to an orthopedic surgeon. He or she will talk with you about your choices.  Preventing knee pain  Losing weight and correcting excess muscle tightness or muscle weakness may help lower your risk.  In some cases, you can prevent knee pain. To help prevent a flare-up of knee pain, you do these things:   Regularly do all the exercises your doctor or physical therapist advises   Support your knee as advised by  your doctor or physical therapist   Increase training gradually, and ease up on training when needed   Have an expert check your gait for running or other sporting activities   Stretch properly before and after exercise   Replace your running shoes regularly   Lose excess weight          2000-2015 [employer]. [address], [place] [address]. All rights reserved. This information is not intended as a substitute for professional medical care. Always follow your healthcare professional's instructions.

## 2015-01-25 ENCOUNTER — Ambulatory Visit (INDEPENDENT_AMBULATORY_CARE_PROVIDER_SITE_OTHER): Payer: PRIVATE HEALTH INSURANCE | Admitting: Orthopaedic Surgery

## 2015-02-05 ENCOUNTER — Emergency Department: Payer: PRIVATE HEALTH INSURANCE

## 2015-02-05 ENCOUNTER — Emergency Department
Admission: EM | Admit: 2015-02-05 | Discharge: 2015-02-05 | Disposition: A | Discharge status: Home / Self Care | Payer: PRIVATE HEALTH INSURANCE | Attending: General Practice | Admitting: General Practice

## 2015-02-05 DIAGNOSIS — M545 Low back pain: Secondary | ICD-10-CM | POA: Insufficient documentation

## 2015-02-05 DIAGNOSIS — G8929 Other chronic pain: Secondary | ICD-10-CM | POA: Insufficient documentation

## 2015-02-05 MED ORDER — METAXALONE 800 MG PO TABS
800.0000 mg | ORAL_TABLET | Freq: Three times a day (TID) | ORAL | Status: DC
Start: 2015-02-05 — End: 2015-03-02

## 2015-02-05 MED ORDER — NAPROXEN 250 MG PO TABS
250.0000 mg | ORAL_TABLET | Freq: Two times a day (BID) | ORAL | Status: DC
Start: 2015-02-05 — End: 2015-03-02

## 2015-02-05 NOTE — ED Provider Notes (Addendum)
Physician/Midlevel provider first contact with patient: 02/05/15 1133         History     Chief Complaint   Patient presents with   . Back Pain     HPI   44 year old female came to emergency room complaining of lower back pain. This started yesterday because of lifting heavy objects. Patient has chronic back problem and it flares up once in a while. Patient has history of fibromyalgia and anxiety. No urinary or GI symptoms      Past Medical History   Diagnosis Date   . Anxiety    . Fibromyalgia        Past Surgical History   Procedure Laterality Date   . Tubal ligation     . Knee surgery     . Cholecystectomy     . Cardiac catheterization  2007       History reviewed. No pertinent family history.    Social  Social History   Substance Use Topics   . Smoking status: Current Every Day Smoker -- 1.00 packs/day     Types: Cigarettes   . Smokeless tobacco: Never Used   . Alcohol Use: No       .     Allergies   Allergen Reactions   . Asa [Aspirin] Anaphylaxis   . Ativan [Lorazepam]    . Codeine    . Klonopin [Clonazepam]    . Morphine    . Penicillins    . Zoloft [Sertraline]        Home Medications                   ALPRAZolam (XANAX) 0.25 MG tablet     TAKE 1 TABLET 3 TIMES A DAY AS NEEDED     meclizine (ANTIVERT) 25 MG tablet     Take 1 tablet (25 mg total) by mouth every 6 (six) hours as needed for Dizziness.     naproxen (NAPROSYN) 250 MG tablet     Take 1 tablet (250 mg total) by mouth 2 (two) times daily with meals.           Review of Systems   Musculoskeletal: Positive for back pain.   All other systems reviewed and are negative.      Physical Exam    BP: 117/59 mmHg, Heart Rate: 95, Temp: 97.2 F (36.2 C), Resp Rate: 18, SpO2: 96 %, Weight: 104.327 kg    Physical Exam   Constitutional: She is oriented to person, place, and time. She appears well-developed and well-nourished. No distress.   HENT:   Head: Normocephalic and atraumatic.   Right Ear: External ear normal.   Left Ear: External ear normal.   Nose: Nose  normal.   Mouth/Throat: Oropharynx is clear and moist. No oropharyngeal exudate.   Neck: Normal range of motion. Neck supple. No JVD present. No tracheal deviation present. No thyromegaly present.   Cardiovascular: Normal rate, regular rhythm and normal heart sounds.    Pulmonary/Chest: Effort normal and breath sounds normal. No stridor. No respiratory distress. She has no wheezes. She has no rales. She exhibits no tenderness.   Abdominal: Soft. Bowel sounds are normal. She exhibits no distension and no mass. There is no tenderness. There is no rebound and no guarding. No hernia.   Musculoskeletal: Normal range of motion. She exhibits tenderness. She exhibits no edema.   Tender lower back starts from L1 to S1 no deformity.   Lymphadenopathy:     She has no  cervical adenopathy.   Neurological: She is alert and oriented to person, place, and time.   Skin: Skin is warm. No rash noted. She is not diaphoretic. No erythema. No pallor.   Nursing note and vitals reviewed.        MDM and ED Course     ED Medication Orders     None             MDM  Patient was advised to rest, Naprosyn 250 mg 1 tablet twice today, Skelaxin 800 mg 1 tablet 3 times a day. See family physician for follow-up or return to emergency room if needed.        Procedures    Clinical Impression & Disposition     Clinical Impression  Final diagnoses:   Chronic bilateral low back pain without sciatica        ED Disposition     Discharge Kathreen Cosier Hartstein discharge to home/self care.    Condition at disposition: Stable             New Prescriptions    No medications on file                   Fredrik Rigger, MD  02/05/15 1144    Radene Gunning Jean Rosenthal, MD  02/05/15 1145

## 2015-02-05 NOTE — Discharge Instructions (Signed)
Back Pain [Acute Or Chronic]    Back pain is usually caused by an injury to the muscles or ligaments of the spine. Sometimes the disks that separate each bone in the spine may bulge and cause pain by pressing on a nearby nerve. Back pain may also appear after a sudden twisting/bending force (such as in a car accident), after a simple awkward movement, or lifting something heavy with poor body positioning. In either case, muscle spasm is often present and adds to the pain.  Acute back pain usually gets better in one to two weeks. Back pain related to disk disease, arthritis in the spinal joints or spinal stenosis (narrowing of the spinal canal) can become chronic and last for months or years.  Unless you had a physical injury (for example, a car accident or fall) X-rays are usually not ordered for the initial evaluation of back pain. If pain continues and does not respond to medical treatment, x-rays and other tests may be performed at a later time.  Home Care:  1. You may need to stay in bed the first few days. But, as soon as possible, begin sitting or walking to avoid problems with prolonged bed rest (muscle weakness, worsening back stiffness and pain, blood clots in the legs).  2. When in bed, try to find a position of comfort. A firm mattress is best. Try lying flat on your back with pillows under your knees. You can also try lying on your side with your knees bent up towards your chest and a pillow between your knees.  3. Avoid prolonged sitting. This puts more stress on the lower back than standing or walking.  4. During the first two days after injury, apply an ICE PACK to the painful area for 20 minutes every 2-4 hours. This will reduce swelling and pain. HEAT (hot shower, hot bath or heating pad) works well for muscle spasm. You can start with ice, then switch to heat after two days. Some patients feel best alternating ice and heat treatments. Use the one method that feels the best to you.  5. You may use  acetaminophen (Tylenol) or ibuprofen (Motrin, Advil) to control pain, unless another pain medicine was prescribed. [NOTE: If you have chronic liver or kidney disease or ever had a stomach ulcer or GI bleeding, talk with your doctor before using these medicines.]  6. Be aware of safe lifting methods and do not lift anything over 15 pounds until all the pain is gone.  Follow Up  with your doctor or this facility if your symptoms do not start to improve after one week. Physical therapy may be needed.  [NOTE: If X-rays were taken, they will be reviewed by a radiologist. You will be notified of any new findings that may affect your care.]  Get Prompt Medical Attention  if any of the following occur:   Pain becomes worse or spreads to your legs   Weakness or numbness in one or both legs   Loss of bowel or bladder control   Numbness in the groin or genital area   2000-2015 [employer] [address], [place] [address]. All rights reserved. This information is not intended as a substitute for professional medical care. Always follow your healthcare professional's instructions.

## 2015-02-14 ENCOUNTER — Other Ambulatory Visit (RURAL_HEALTH_CENTER): Payer: Self-pay

## 2015-02-14 MED ORDER — ALPRAZOLAM 0.25 MG PO TABS
ORAL_TABLET | ORAL | Status: DC
Start: ? — End: 2015-02-14

## 2015-03-02 ENCOUNTER — Encounter (RURAL_HEALTH_CENTER): Payer: Self-pay | Admitting: Family Medicine

## 2015-03-02 ENCOUNTER — Telehealth (RURAL_HEALTH_CENTER): Payer: Self-pay | Admitting: Family Medicine

## 2015-03-02 ENCOUNTER — Ambulatory Visit (RURAL_HEALTH_CENTER): Payer: PRIVATE HEALTH INSURANCE | Admitting: Family Medicine

## 2015-03-02 VITALS — BP 156/88 | HR 84 | Temp 98.4°F | Resp 16 | Ht 64.02 in | Wt 238.0 lb

## 2015-03-02 DIAGNOSIS — R1013 Epigastric pain: Secondary | ICD-10-CM

## 2015-03-02 DIAGNOSIS — J209 Acute bronchitis, unspecified: Secondary | ICD-10-CM

## 2015-03-02 DIAGNOSIS — G894 Chronic pain syndrome: Secondary | ICD-10-CM

## 2015-03-02 DIAGNOSIS — F17200 Nicotine dependence, unspecified, uncomplicated: Secondary | ICD-10-CM

## 2015-03-02 MED ORDER — PANTOPRAZOLE SODIUM 40 MG PO TBEC
40.0000 mg | DELAYED_RELEASE_TABLET | Freq: Every day | ORAL | Status: DC
Start: ? — End: 2015-03-02

## 2015-03-02 MED ORDER — PREDNISONE 10 MG PO TABS
ORAL_TABLET | ORAL | Status: DC
Start: ? — End: 2015-03-02

## 2015-03-02 MED ORDER — AZITHROMYCIN 250 MG PO TABS
ORAL_TABLET | ORAL | Status: DC
Start: ? — End: 2015-03-02

## 2015-03-02 MED ORDER — ALBUTEROL SULFATE HFA 108 (90 BASE) MCG/ACT IN AERS
2.0000 | INHALATION_SPRAY | RESPIRATORY_TRACT | Status: DC | PRN
Start: ? — End: 2015-03-02

## 2015-03-02 NOTE — Progress Notes (Signed)
Subjective:    Patient ID: Anita Aguilar is a 44 y.o. female.    HPI  Chief Complaint   Patient presents with   . Cough   . Hip Pain     bil hips goes to both legs worse on Right      c/o 3-5 days of cough producing purulent sputum and chest congestion.  Denies fever.  Denies dyspnea.  OTCs not helping.  No household sick contacts.    She states that she used Protonix in the past for acid reflux and she would like a refill.  She reports many years of acid reflux.  She smokes ~1ppd for 30 years.  Denies alcohol.    Finally, she reports achiness in her legs "like charlie horses."  She thinks this is from her fibromyalgia.  She has not mentioned this to her rheumatologist.  She cannot afford the specialist copay.  She could not afford the copay for pool therapy that rheum recommended.    Gabapentin has not helped in the past.  Lyrica was tried in [place] and this made her symptoms worse.  She also states that she has tried Cybalta.  "Everything that's been on TV, I've tried."    Review of Systems   Constitutional: Negative for fever and chills.   Respiratory: Positive for cough. Negative for shortness of breath and wheezing.    Cardiovascular: Negative for chest pain and palpitations.   Gastrointestinal: Negative for nausea, vomiting, diarrhea and constipation.   Neurological: Negative for dizziness and headaches.   All other systems reviewed and are negative.          Objective:    Physical Exam   Constitutional: She is oriented to person, place, and time. She appears well-developed and well-nourished. No distress.   HENT:   Head: Normocephalic and atraumatic.   Neck: Normal range of motion. Neck supple.   Cardiovascular: Normal rate, regular rhythm and normal heart sounds.    Pulmonary/Chest: Effort normal. She has wheezes (scattered EEWs in all lung fields).   Abdominal: Soft. Bowel sounds are normal. She exhibits no distension. There is no tenderness.   Lymphadenopathy:     She has no cervical  adenopathy.   Neurological: She is alert and oriented to person, place, and time. She displays normal reflexes. She exhibits normal muscle tone.   Skin: Skin is warm and dry. No rash noted.   Psychiatric: She has a normal mood and affect. Her behavior is normal.   Nursing note and vitals reviewed.          Assessment:       1. Bronchitis with bronchospasm    2. Chronic pain syndrome    3. Dyspepsia    4. Smoker          Plan:       Zpack  Albuterol HFA  Prednisone taper hopefully will help both with her bronchospasm as well as provide at least temporary relief of her pain.  Protonix  Refer for persistent chronic reflux symptoms in a smoker  RTC prn

## 2015-03-02 NOTE — Telephone Encounter (Signed)
Cannot afford inhaler asking for something cheaper for cough.

## 2015-03-06 NOTE — Telephone Encounter (Signed)
Patient states not really any better.  She sounded short of breath and was coughing while I was talking to her on the phone.

## 2015-03-06 NOTE — Telephone Encounter (Signed)
Is she feeling better?

## 2015-03-06 NOTE — Telephone Encounter (Signed)
She needs to come in and see [facility] or me for re-evaluation; or go to ER if she is short of breath

## 2015-03-07 NOTE — Telephone Encounter (Signed)
Pt aware unable to come in today. Made appt for am with [facility]

## 2015-03-08 ENCOUNTER — Ambulatory Visit
Admission: RE | Admit: 2015-03-08 | Discharge: 2015-03-08 | Disposition: A | Discharge status: Home / Self Care | Payer: PRIVATE HEALTH INSURANCE | Source: Ambulatory Visit | Attending: Family | Admitting: Family

## 2015-03-08 ENCOUNTER — Telehealth (RURAL_HEALTH_CENTER): Payer: Self-pay

## 2015-03-08 ENCOUNTER — Encounter (RURAL_HEALTH_CENTER): Payer: Self-pay | Admitting: Family

## 2015-03-08 ENCOUNTER — Ambulatory Visit (RURAL_HEALTH_CENTER): Payer: PRIVATE HEALTH INSURANCE | Admitting: Family

## 2015-03-08 VITALS — BP 120/90 | HR 86 | Temp 98.5°F | Resp 18 | Ht 64.02 in | Wt 236.0 lb

## 2015-03-08 DIAGNOSIS — R05 Cough: Secondary | ICD-10-CM | POA: Insufficient documentation

## 2015-03-08 DIAGNOSIS — J9801 Acute bronchospasm: Secondary | ICD-10-CM

## 2015-03-08 DIAGNOSIS — F17200 Nicotine dependence, unspecified, uncomplicated: Secondary | ICD-10-CM | POA: Insufficient documentation

## 2015-03-08 DIAGNOSIS — R059 Cough, unspecified: Secondary | ICD-10-CM

## 2015-03-08 DIAGNOSIS — R053 Chronic cough: Secondary | ICD-10-CM

## 2015-03-08 MED ORDER — FLUTICASONE FUROATE-VILANTEROL 100-25 MCG/INH IN AEPB
1.0000 | INHALATION_SPRAY | Freq: Every day | RESPIRATORY_TRACT | Status: DC
Start: ? — End: 2015-03-08

## 2015-03-08 MED ORDER — ALBUTEROL-IPRATROPIUM 2.5-0.5 (3) MG/3ML IN SOLN
3.0000 mL | Freq: Once | RESPIRATORY_TRACT | Status: DC
Start: ? — End: 2015-03-08
  Administered 2015-03-08: 3 mL via RESPIRATORY_TRACT

## 2015-03-08 MED ORDER — ALBUTEROL SULFATE (2.5 MG/3ML) 0.083% IN NEBU
2.5000 mg | INHALATION_SOLUTION | Freq: Four times a day (QID) | RESPIRATORY_TRACT | Status: DC | PRN
Start: ? — End: 2015-03-08

## 2015-03-08 MED ORDER — BUPROPION HCL ER (SR) 150 MG PO TB12
150.0000 mg | ORAL_TABLET | Freq: Two times a day (BID) | ORAL | Status: DC
Start: ? — End: 2015-03-08

## 2015-03-08 NOTE — Telephone Encounter (Signed)
The patient has been notified of this information and all questions answered.

## 2015-03-08 NOTE — Progress Notes (Signed)
Subjective:    Patient ID: Anita Aguilar is a 44 y.o. female.    HPI    Patient presents today for evaluation of continued cough and chest congestion. Patient was seen on 03/02/15 and treated for bronchitis with bronchospasm with Zithromax, prednisone taper, and albuterol inhaler. Patient tells me she could not afford the albuterol inhaler but finished her Z-Pak and is almost finished with her prednisone taper. Her symptoms remain unchanged. Patient continues to smoke cigarettes, however she has been smoking much less since she has been sick for the past 2 weeks.    No fever, chills, sore throat, otalgia, vomiting, diarrhea, or other complaint. Patient states her finances are extremely limited so she can only afford the cheapest of medicines.    Review of Systems   Constitutional: Positive for fatigue. Negative for fever.   HENT: Positive for congestion (chest).    Respiratory: Positive for cough and shortness of breath.    Cardiovascular: Negative for chest pain.   All other systems reviewed and are negative.          Objective:    Physical Exam   Constitutional: She is oriented to person, place, and time. She appears well-developed and well-nourished.   HENT:   Head: Normocephalic and atraumatic.   Right Ear: Tympanic membrane normal.   Left Ear: Tympanic membrane normal.   Nose: Nose normal. Right sinus exhibits no maxillary sinus tenderness and no frontal sinus tenderness. Left sinus exhibits no maxillary sinus tenderness and no frontal sinus tenderness.   Mouth/Throat: Uvula is midline and oropharynx is clear and moist.   Neck: Normal range of motion. Neck supple. No JVD present.   Cardiovascular: Normal rate, regular rhythm and normal heart sounds.    Pulmonary/Chest: She has decreased breath sounds (over all lung fields). She has no wheezes. She has no rhonchi. She has no rales.   Lymphadenopathy:     She has no cervical adenopathy.   Neurological: She is alert and oriented to person, place, and time.    Skin: Skin is warm and dry.   Psychiatric: She exhibits a depressed mood. She expresses no homicidal and no suicidal ideation.   Pt is intermittently tearful today throughout our interview.           Assessment:         Cough, persistent    Bronchospasm    Tobacco dependence    Plan:     Although never formally diagnosed, I suspect this patient has COPD related to her long smoking history.  After this acute illness has subsided, I would like her to have PFT's.      Chest x-ray today    DuoNeb here in the office with good response: less cough and increased air movement throughout all lung fields.    Because the patient cannot afford the albuterol inhaler, I will try to get her a nebulizer machine with albuterol nebulizer solution which is more affordable.      Prescription for nebulizer machine faxed to [facility].    Prescription for Albuterol nebulizer solution sent to [facility]. Use 1 container via nebulizer machine every 4-6 hours when necessary for wheezing/cough.    I will start the patient on an ICS/LABA for suspected COPD.  New prescription for [facility] provided today.  Free 30-day trial card from the drug company provided for the patient today.    Start Wellbutrin SR for smoking cessation.    Follow up in 1 month and PRN.

## 2015-03-08 NOTE — Telephone Encounter (Signed)
L/m to call the office.

## 2015-03-08 NOTE — Progress Notes (Signed)
reviewed and agree with treatment plan

## 2015-03-08 NOTE — Telephone Encounter (Signed)
-----   Message from Kalman Shan, NP sent at 03/08/2015 12:27 PM EDT -----  No pneumonia on CXR.  Some changes from smoking are seen.  Finish prednisone taper and start inhaler prescribed today.  Hopefully insurance will cover the nebulizer machine, pt should get a phone call about that.  Once approved, start the albuterol through the nebulizer as well.  Follow up in 1 month to check on progress with new meds.    Thanks,  [employer]

## 2015-03-12 ENCOUNTER — Emergency Department
Admission: EM | Admit: 2015-03-12 | Discharge: 2015-03-12 | Disposition: A | Discharge status: Home / Self Care | Payer: PRIVATE HEALTH INSURANCE | Attending: Emergency Medicine | Admitting: Emergency Medicine

## 2015-03-12 ENCOUNTER — Emergency Department: Payer: PRIVATE HEALTH INSURANCE

## 2015-03-12 DIAGNOSIS — M79604 Pain in right leg: Secondary | ICD-10-CM | POA: Insufficient documentation

## 2015-03-12 LAB — D-DIMER, QUANTITATIVE: D-Dimer: 0.67 mg/L FEU — ABNORMAL HIGH (ref 0.19–0.52)

## 2015-03-12 MED ORDER — ENOXAPARIN SODIUM 100 MG/ML SC SOLN
107.0000 mg | Freq: Once | SUBCUTANEOUS | Status: AC
Start: 2015-03-12 — End: 2015-03-12
  Administered 2015-03-12: 110 mg via SUBCUTANEOUS

## 2015-03-12 MED ORDER — ENOXAPARIN SODIUM 150 MG/ML SC SOLN
SUBCUTANEOUS | Status: AC
Start: 2015-03-12 — End: ?
  Filled 2015-03-12: qty 1

## 2015-03-12 NOTE — Discharge Instructions (Addendum)
Possible Causes of Low Back or Leg Pain    The symptoms in your back or leg may be due to pressure on a nerve. This pressure may be caused by a damaged disk or by abnormal bone growth. Either way, you may feel pain, burning, tingling, or numbness. If you have pressure on a nerve that connects to the sciatic nerve, pain may shoot down your leg.    Pressure from the Disk  Constant wear and tear can weaken a disk over time. The disk can then be damaged by a sudden movement or injury. If its soft center begins to bulge, the disk may press on a nerve. Or the outside of the disk may tear, and the soft center may squeeze through and pinch a nerve.    Pressure from Bone  As a disk wears out, the vertebrae right above and below the disk begin to touch. This can put pressure on a nerve. Often, abnormal bone (called bone spurs) grows where the vertebrae rub against each other. This can cause the foramen or the spinal canal to narrow (called stenosis ) and press against a nerve.   2000-2015 [employer] [address], [place] [address]. All rights reserved. This information is not intended as a substitute for professional medical care. Always follow your healthcare professional's instructions.

## 2015-03-12 NOTE — Special Discharge Instructions (Signed)
To x-ray tomorrow for U/S of leg

## 2015-03-12 NOTE — ED Provider Notes (Signed)
[facility]   EMERGENCY DEPARTMENT HISTORY AND PHYSICAL EXAM    Date: 03/12/2015  Patient Name: Anita Aguilar  Attending Physician: Otelia Sergeant, MD  Patient DOB:  [birth year]  MRN:  [id number]  Room:  ED4/ED4-A    Patient was evaluated by ED physician,B. Marchelle Folks, MD at 6:31 PM    History     Chief Complaint   Patient presents with   . Leg Pain     RT UPPER       HPI:  The patient Anita Aguilar, is a 44 y.o. female who presents with chief complaint of 3-4 days of right thigh pain  No injury  No swelling  No SOB  No other complaints    Location- right thigh  Severity- mild  Duration- 3 days  Radiation -   Character- discomfort  Onset- grad  Modifying-    Associated symptoms-     PCP:  Kalman Shan, NP      Past Medical History       Past Medical History   Diagnosis Date   . Anxiety    . Fibromyalgia          Past Surgical History       Past Surgical History   Procedure Laterality Date   . Tubal ligation     . Knee surgery     . Cholecystectomy     . Cardiac catheterization  2007         Family History    No family history on file.    Social History    Social History     Social History   . Marital Status: Married     Spouse Name: N/A   . Number of Children: N/A   . Years of Education: N/A     Social History Main Topics   . Smoking status: Current Every Day Smoker -- 1.00 packs/day     Types: Cigarettes   . Smokeless tobacco: Never Used   . Alcohol Use: No   . Drug Use: No   . Sexual Activity: Not Asked     Other Topics Concern   . None     Social History Narrative       Allergies    Allergies   Allergen Reactions   . Asa [Aspirin] Anaphylaxis   . Ativan [Lorazepam]    . Codeine    . Klonopin [Clonazepam]    . Morphine    . Penicillins    . Zoloft [Sertraline]          Current/Home Medications    Current/Home Medications    ALBUTEROL (PROVENTIL) (2.5 MG/3ML) 0.083% NEBULIZER SOLUTION    Take 3 mLs (2.5 mg total) by nebulization every 6 (six) hours as needed for Wheezing or Shortness of  Breath.    ALPRAZOLAM (XANAX) 0.25 MG TABLET    TAKE 1 TABLET 3 TIMES A DAY AS NEEDED    BUPROPION SR (WELLBUTRIN SR) 150 MG 12 HR TABLET    Take 1 tablet (150 mg total) by mouth 2 (two) times daily. Take 1 tablet PO daily for first 3 days.  Then BID thereafter.    FLUTICASONE FUROATE-VILANTEROL (BREO ELLIPTA) 100-25 MCG/INH AEROSOL POWDER, BREATH ACTIVTIVATEDE    Inhale 1 puff into the lungs daily.    PANTOPRAZOLE (PROTONIX) 40 MG TABLET    Take 1 tablet (40 mg total) by mouth daily.       Vital Signs     BP 151/90 mmHg  Pulse 100  Temp(Src) 98.8 F (37.1 C) (Tympanic)  Resp 16  Ht 1.6 m  Wt 107 kg  BMI 41.80 kg/m2  SpO2 99%  LMP 03/10/2015  Patient Vitals for the past 24 hrs:   BP Temp Temp src Pulse Resp SpO2 Height Weight   03/12/15 1813 151/90 mmHg 98.8 F (37.1 C) Tympanic 100 16 99 % 1.6 m 107 kg         Review of Systems     Constitutional: No fever      Head: No headache    Eyes: No  redness or pain    ENT: No ear pain  No sore throat    Cardiovascular: no c/o chest pain.  No DOE.  No orthopnea    Respiratory: No cough    No shortness of breath.      GI: No vomiting.  No diarrhea. No abd pain    Genitourinary:   no frequency, urgency, pain, burning    Musculoskeletal:   Right thigh pain    No edema    No symptoms of DVT    Skin: no rashes or skin lesions.    Neurologic:no c/o weakness or paresthesia     No change in speech or vision    All other systems reviewed and negative      Physical Exam     CONSTITUTIONAL:  Vital signs reviewed     Well appearing,  Patient appears comfortable    Alert and oriented X 3    HEAD:   Atraumatic    Normocephalic.    EENT: Normal    NECK:   Normal ROM           RESPIRATORY / CHEST:   Nontender   Breath sounds normal    CARDIOVASCULAR:   RRR     No murmur     No JVD    ABDOMEN: Soft   Nontender         UPPER EXTREMITY:   Inspection normal         LOWER EXTREMITY:   Inspection normal   C/o vague pain in ant/lat thigh  No redness  No palp cords  Normal color/temp of  entire right leg     No edema    No sign of DVT        NEURO: Normal motor, sensory of all ext     l     SPINE:      SKIN:  Warm,  Dry, Normal color, No rashes    LYMPHATIC:         ED Medication Orders     ED Medication Orders     Start Ordered     Status Ordering Provider    03/12/15 1927 03/12/15 1928  enoxaparin (LOVENOX) syringe 110 mg   Once     Route: Subcutaneous  Ordered Dose: 107 mg     Acknowledged Beola Cord          Orders Placed During this Encounter     Orders Placed This Encounter   Procedures   . D-dimer, quantitative       Diagnostic Study Results     Labs     Results     Procedure Component Value Units Date/Time    D-dimer, quantitative [[id number]]  (Abnormal) Collected:  03/12/15 1858    Specimen Information:  Blood Updated:  03/12/15 1912     D-Dimer 0.67 (H) mg/L FEU  Radiologic Studies  Radiology Results (24 Hour)     ** No results found for the last 24 hours. **      .    Clinical Course / MDM     Notes:     Consults:       Data Review     Nursing records reviewed and agree: Yes    Pulse Oximetry Analysis - Normal  Laboratory results reviewed by EDP: Yes  Radiologic study results reviewed by EDP: No      Rendering Provider: Beola Cord, MD      Monitors, EKG     Cardiac Monitor (interpreted by ED physician):      EKG (interpreted by ED physician):       Critical Care     Critical care exclusive of time spent performing procedures.    Total time:          Clinical Impression & Disposition     Clinical Impression:  1. Right leg pain        Disposition  ED Disposition     Discharge Kathreen Cosier Santori discharge to home/self care.    Condition at disposition: Stable            Prescriptions    New Prescriptions    No medications on file         Signed,  B. Marchelle Folks, MD  7:33 PM 03/12/2015                Beola Cord, MD  03/12/15 [phone]

## 2015-03-12 NOTE — ED Notes (Signed)
RT UPPER LEG PAIN/TIGHTNESS FOR 3 DAYS. PT DENIES INJURY. PAIN WORSE AT NIGHT. PAIN RADIATES TO FOOT INTERMITTENTLY

## 2015-03-13 ENCOUNTER — Other Ambulatory Visit (RURAL_HEALTH_CENTER): Payer: Self-pay | Admitting: Emergency Medicine

## 2015-03-13 ENCOUNTER — Ambulatory Visit
Admission: RE | Admit: 2015-03-13 | Discharge: 2015-03-13 | Disposition: A | Discharge status: Home / Self Care | Payer: PRIVATE HEALTH INSURANCE | Source: Ambulatory Visit | Attending: Emergency Medicine | Admitting: Emergency Medicine

## 2015-03-13 ENCOUNTER — Telehealth (RURAL_HEALTH_CENTER): Payer: Self-pay | Admitting: Family

## 2015-03-13 DIAGNOSIS — R52 Pain, unspecified: Secondary | ICD-10-CM

## 2015-03-13 DIAGNOSIS — M79604 Pain in right leg: Secondary | ICD-10-CM | POA: Insufficient documentation

## 2015-03-13 NOTE — Telephone Encounter (Signed)
-----   Message from Kalman Shan, NP sent at 03/13/2015  4:31 PM EDT -----  No blood clot visualized on [place].  If pain persists, f/u in clinic with either myself or Dr. Nedra Hai.

## 2015-03-13 NOTE — Telephone Encounter (Signed)
Pt aware.

## 2015-03-14 ENCOUNTER — Telehealth (RURAL_HEALTH_CENTER): Payer: Self-pay | Admitting: Family

## 2015-03-14 ENCOUNTER — Other Ambulatory Visit (RURAL_HEALTH_CENTER): Payer: Self-pay | Admitting: Family

## 2015-03-14 ENCOUNTER — Ambulatory Visit (RURAL_HEALTH_CENTER): Payer: PRIVATE HEALTH INSURANCE | Admitting: Family

## 2015-03-14 ENCOUNTER — Encounter (RURAL_HEALTH_CENTER): Payer: Self-pay | Admitting: Family

## 2015-03-14 VITALS — BP 140/82 | HR 82 | Temp 98.6°F | Resp 16 | Ht 64.02 in | Wt 236.0 lb

## 2015-03-14 DIAGNOSIS — M79604 Pain in right leg: Secondary | ICD-10-CM

## 2015-03-14 DIAGNOSIS — R05 Cough: Secondary | ICD-10-CM

## 2015-03-14 DIAGNOSIS — F17200 Nicotine dependence, unspecified, uncomplicated: Secondary | ICD-10-CM

## 2015-03-14 DIAGNOSIS — R053 Chronic cough: Secondary | ICD-10-CM

## 2015-03-14 MED ORDER — HYDROCOD POLST-CPM POLST ER 10-8 MG/5ML PO SUER
5.0000 mL | Freq: Two times a day (BID) | ORAL | Status: DC | PRN
Start: ? — End: 2015-03-14

## 2015-03-14 MED ORDER — BENZONATATE 100 MG PO CAPS
100.0000 mg | ORAL_CAPSULE | Freq: Three times a day (TID) | ORAL | Status: DC | PRN
Start: ? — End: 2015-03-14

## 2015-03-14 NOTE — Progress Notes (Signed)
reviewed and agree with treatment plan

## 2015-03-14 NOTE — Telephone Encounter (Signed)
Changed to tessalon perles.

## 2015-03-14 NOTE — Progress Notes (Addendum)
SUBJECTIVE  HPI: Anita Aguilar is a 44 y.o. female who comes to the clinic for follow-up of RLE pain following a recent ED visit. See ED notes from 10/23. She had a negative ultrasound (10/24) of the RLE, ruling out DVT. This pain is in the anterior and posterior thigh, is intermittent, throbbing in character, and has been improving. She believes the rest that she's gotten over the past 2 days has made it feel better. She also c/o a cough which has now persisted for >1 month. It is productive of white sputum. She has been seen and had a CXR (10/19) since onset. She denies shortness of breath. Denies chest pain. She has been a heavy smoker for the better part of 25 years. She is currently undergoing smoking cessation therapy with wellbutrin and reports significant results. She is down to 3 cigarettes per day and is pleased with this progress. She was prescribed a ICS/LABA inhaler earlier this month, which she has been using as directed but has not noticed a change in symptoms. She was also prescribed albuterol nebules to be used during coughing episodes, but the nebulizer was too expensive through her insurance so she has been unable to try this medication.    Review of Systems - History obtained from the patient  General ROS: negative for - fatigue, fever, hot flashes, malaise, night sweats or sleep disturbance  Psychological ROS: negative for - depression or mood swings  ENT ROS: negative for - epistaxis, headaches, nasal congestion, sinus pain, sore throat, tinnitus or vertigo  Allergy and Immunology ROS: negative for - hives  Respiratory ROS: positive for - cough  negative for - hemoptysis, orthopnea, pleuritic pain, shortness of breath or wheezing  Cardiovascular ROS: no chest pain or dyspnea on exertion  Gastrointestinal ROS: no abdominal pain, change in bowel habits, or black or bloody stools  Musculoskeletal ROS: positive for - pain in leg - right, as per HPI  negative for - joint stiffness, joint  swelling, muscular weakness or changes of her chronic knee pain  Neurological ROS: negative for - behavioral changes, confusion, dizziness, numbness/tingling or weakness  Dermatological ROS: negative for pruritus, rash and bruising      OBJECTIVE  Filed Vitals:    03/14/15 1300   BP: 140/82   Pulse: 82   Temp: 98.6 F (37 C)   Resp: 16      Physical Examination: General appearance - alert, well appearing, and in no distress and overweight  Mental status - alert, oriented to person, place, and time, normal mood, behavior, speech, dress, motor activity, and thought processes  Ears - bilateral TM's and external ear canals normal  Nose - normal and patent, no erythema, discharge or polyps  Mouth - mucous membranes moist, pharynx normal without lesions  Neck - supple, no significant adenopathy  Lymphatics - no palpable lymphadenopathy  Chest - clear to auscultation, no wheezes, rales or rhonchi, symmetric air entry  Heart - normal rate, regular rhythm, normal S1, S2, no murmurs, rubs, clicks or gallops  Musculoskeletal - no joint tenderness, deformity or swelling, specifically no tenderness to right thigh where she has been having pain  Extremities - peripheral pulses normal, no pedal edema, no clubbing or cyanosis  Skin - normal coloration and turgor, no rashes, no suspicious skin lesions noted      ASSESSMENT  Cough-chronic  Tobacco dependence  Right Leg pain-superior        PLAN  PT was provided with resources to obtain an affordable  nebulizer machine. She has agreed to purchase the machine and start the albuterol as previously prescribed. For the mean time, we will prescribe Tussionex for cough to be used as needed. Sedation precautions were reviewed with the patient, and she verbalized understanding. We also have ordered PFTs, as there is a reasonable suspicion that she has underlying COPD and her cough may be due to chronic bronchitis.  She is making good progress with smoking cessation and was given encouragement  to continue. We will watch the leg pain and she will monitor her symptoms at home. We will follow up with her based on PFT results.  She will return if symptoms worsen or fail to improve and with any other concerns. She is agreeable with this plan.    This note was completed by Vanna Scotland,  Nurse Practitioner student.  It has been personally reviewed for accuracy and completeness prior to submission.  Phill Myron, NP-C

## 2015-03-14 NOTE — Telephone Encounter (Signed)
The patient has been notified of this information and all questions answered.

## 2015-03-14 NOTE — Telephone Encounter (Signed)
Pt cannot afford cough syrup can you change please

## 2015-03-24 ENCOUNTER — Other Ambulatory Visit (RURAL_HEALTH_CENTER): Payer: Self-pay | Admitting: Family

## 2015-03-27 ENCOUNTER — Other Ambulatory Visit (RURAL_HEALTH_CENTER): Payer: Self-pay | Admitting: Family

## 2015-03-28 ENCOUNTER — Telehealth (RURAL_HEALTH_CENTER): Payer: Self-pay

## 2015-03-28 ENCOUNTER — Other Ambulatory Visit (RURAL_HEALTH_CENTER): Payer: Self-pay | Admitting: Family

## 2015-03-28 ENCOUNTER — Ambulatory Visit
Admission: RE | Admit: 2015-03-28 | Discharge: 2015-03-28 | Disposition: A | Discharge status: Home / Self Care | Payer: PRIVATE HEALTH INSURANCE | Source: Ambulatory Visit | Attending: Family | Admitting: Family

## 2015-03-28 DIAGNOSIS — R05 Cough: Secondary | ICD-10-CM | POA: Insufficient documentation

## 2015-03-28 DIAGNOSIS — R0602 Shortness of breath: Secondary | ICD-10-CM

## 2015-03-28 DIAGNOSIS — R058 Other specified cough: Secondary | ICD-10-CM

## 2015-03-28 NOTE — Telephone Encounter (Signed)
Patient called c/o SOB for the last 3-4 days, today has noticed more, due to being in and out more.  She is down to 1-2 cigs a day and someday's, she doesn't have any.  She is now having a productive cough.  Wondering if the SOB could be from decreasing the cigs and stuff breaking up in her lungs.

## 2015-03-29 ENCOUNTER — Telehealth (RURAL_HEALTH_CENTER): Payer: Self-pay | Admitting: Family

## 2015-03-29 NOTE — Telephone Encounter (Signed)
Pt aware.

## 2015-03-29 NOTE — Telephone Encounter (Signed)
I will order a chest xray and we can go from there.

## 2015-03-29 NOTE — Telephone Encounter (Signed)
Patient aware.

## 2015-03-29 NOTE — Telephone Encounter (Signed)
-----   Message from Kalman Shan, NP sent at 03/29/2015  8:46 AM EST -----  Normal CXR.  Some positive changes are already visualized from the patient reducing her smoking so keep up the good work.

## 2015-03-31 ENCOUNTER — Encounter (INDEPENDENT_AMBULATORY_CARE_PROVIDER_SITE_OTHER): Payer: Self-pay

## 2015-04-04 ENCOUNTER — Emergency Department
Admission: EM | Admit: 2015-04-04 | Discharge: 2015-04-04 | Disposition: A | Discharge status: Home / Self Care | Payer: PRIVATE HEALTH INSURANCE | Attending: Family Medicine | Admitting: Family Medicine

## 2015-04-04 ENCOUNTER — Emergency Department: Payer: PRIVATE HEALTH INSURANCE

## 2015-04-04 DIAGNOSIS — K029 Dental caries, unspecified: Secondary | ICD-10-CM | POA: Insufficient documentation

## 2015-04-04 MED ORDER — CLINDAMYCIN HCL 150 MG PO CAPS
ORAL_CAPSULE | ORAL | Status: AC
Start: 2015-04-04 — End: ?
  Filled 2015-04-04: qty 4

## 2015-04-04 MED ORDER — TRAMADOL HCL 50 MG PO TABS
50.0000 mg | ORAL_TABLET | Freq: Four times a day (QID) | ORAL | Status: DC | PRN
Start: 2015-04-04 — End: 2015-06-05

## 2015-04-04 MED ORDER — ACETAMINOPHEN 500 MG PO TABS
ORAL_TABLET | ORAL | Status: AC
Start: 2015-04-04 — End: ?
  Filled 2015-04-04: qty 2

## 2015-04-04 MED ORDER — TRAMADOL HCL 50 MG PO TABS
50.0000 mg | ORAL_TABLET | Freq: Four times a day (QID) | ORAL | Status: DC | PRN
Start: 2015-04-04 — End: 2015-04-05
  Administered 2015-04-04: 50 mg via ORAL

## 2015-04-04 MED ORDER — LIDOCAINE VISCOUS 2 % MT SOLN
5.0000 mL | Freq: Four times a day (QID) | OROMUCOSAL | Status: DC | PRN
Start: 2015-04-04 — End: 2015-09-21

## 2015-04-04 MED ORDER — LIDOCAINE VISCOUS 2 % MT SOLN
OROMUCOSAL | Status: AC
Start: 2015-04-04 — End: ?
  Filled 2015-04-04: qty 45

## 2015-04-04 MED ORDER — TRAMADOL HCL 50 MG PO TABS
ORAL_TABLET | ORAL | Status: AC
Start: 2015-04-04 — End: ?
  Filled 2015-04-04: qty 3

## 2015-04-04 MED ORDER — LIDOCAINE VISCOUS 2 % MT SOLN
5.0000 mL | Freq: Four times a day (QID) | OROMUCOSAL | Status: DC | PRN
Start: 2015-04-04 — End: 2015-04-05
  Administered 2015-04-04: 5 mL via ORAL

## 2015-04-04 MED ORDER — CLINDAMYCIN HCL 150 MG PO CAPS
300.0000 mg | ORAL_CAPSULE | Freq: Three times a day (TID) | ORAL | Status: DC
Start: 2015-04-04 — End: 2015-04-05
  Administered 2015-04-04: 300 mg via ORAL

## 2015-04-04 MED ORDER — CLINDAMYCIN HCL 300 MG PO CAPS
300.0000 mg | ORAL_CAPSULE | Freq: Three times a day (TID) | ORAL | Status: AC
Start: 2015-04-04 — End: 2015-04-14

## 2015-04-04 MED ORDER — ACETAMINOPHEN 500 MG PO TABS
1000.0000 mg | ORAL_TABLET | Freq: Once | ORAL | Status: AC
Start: 2015-04-04 — End: 2015-04-04
  Administered 2015-04-04: 1000 mg via ORAL

## 2015-04-04 NOTE — ED Notes (Signed)
Pt c/o rt upper teeth pain and swelling since last PM, thinks something may be stuck in between the teeth and has caused it to be painful and swollen.

## 2015-04-04 NOTE — ED Provider Notes (Signed)
Physician/Midlevel provider first contact with patient: 04/04/15 2138         History     Chief Complaint   Patient presents with   . Dental Pain     Pt c/o rt upper teeth pain and swelling since last PM, thinks something may be stuck in between the teeth and has caused it to be painful and swollen.      HPI     Presents with 24 hours of severe right upper molar pain and swelling.  She has a known broken right upper molar and she feels that this broken tooth is the culprit.  She has taken tylenol with minimal relief.  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  She does not have a regular dentist.    Past Medical History   Diagnosis Date   . Anxiety    . Fibromyalgia        Past Surgical History   Procedure Laterality Date   . Tubal ligation     . Knee surgery     . Cholecystectomy     . Cardiac catheterization  2007       History reviewed. No pertinent family history.    Social  Social History   Substance Use Topics   . Smoking status: Current Every Day Smoker -- 1.00 packs/day     Types: Cigarettes   . Smokeless tobacco: Never Used   . Alcohol Use: No       .     Allergies   Allergen Reactions   . Asa [Aspirin] Anaphylaxis   . Ativan [Lorazepam]    . Codeine    . Klonopin [Clonazepam]    . Morphine    . Penicillins    . Zoloft [Sertraline]        Home Medications     Last Medication Reconciliation Action:  Complete Anita Gula, RN 04/04/2015  6:39 PM                  albuterol (PROVENTIL) (2.5 MG/3ML) 0.083% nebulizer solution     Take 3 mLs (2.5 mg total) by nebulization every 6 (six) hours as needed for Wheezing or Shortness of Breath.     ALPRAZolam (XANAX) 0.25 MG tablet     TAKE ONE TABLET 3 TIMES A DAY AS NEEDED     benzonatate (TESSALON PERLES) 100 MG capsule     Take 1 capsule (100 mg total) by mouth 3 (three) times daily as needed for Cough.     buPROPion SR (WELLBUTRIN SR) 150 MG 12 hr tablet     Take 1 tablet (150 mg total) by mouth 2 (two) times daily. Take 1 tablet PO daily  for first 3 days.  Then BID thereafter.     fluticasone furoate-vilanterol (BREO ELLIPTA) 100-25 MCG/INH Aerosol Powder, Breath Activtivatede     Inhale 1 puff into the lungs daily.     pantoprazole (PROTONIX) 40 MG tablet     Take 1 tablet (40 mg total) by mouth daily.           Review of Systems   Constitutional: Negative for fever and chills.   Respiratory: Negative for shortness of breath and wheezing.    Cardiovascular: Negative for chest pain and palpitations.   Gastrointestinal: Negative for nausea, vomiting, diarrhea and constipation.   Neurological: Negative for dizziness and headaches.   All other systems reviewed and are negative.      Physical Exam  BP: 140/80 mmHg, Heart Rate: 85, Temp: 98.4 F (36.9 C), Resp Rate: 18, SpO2: 98 %, Weight: 106.595 kg    Physical Exam   Constitutional: She is oriented to person, place, and time. She appears well-developed and well-nourished. No distress.   HENT:   Head: Normocephalic and atraumatic.   Mouth/Throat:       Neck: Normal range of motion. Neck supple.   Cardiovascular: Normal rate, regular rhythm and normal heart sounds.    Pulmonary/Chest: Effort normal and breath sounds normal.   Abdominal: Soft. Bowel sounds are normal. She exhibits no distension. There is no tenderness.   Lymphadenopathy:     She has no cervical adenopathy.   Neurological: She is alert and oriented to person, place, and time. She displays normal reflexes. She exhibits normal muscle tone.   Skin: Skin is warm and dry. No rash noted.   Psychiatric: She has a normal mood and affect. Her behavior is normal.   Nursing note and vitals reviewed.        MDM and ED Course     ED Medication Orders     Start Ordered     Status Ordering Provider    04/04/15 2200 04/04/15 2155  clindamycin (CLEOCIN) (take home medication)   3 times daily     Route: Oral  Ordered Dose: 300 mg     Ordered Anita Aguilar    04/04/15 2153 04/04/15 2155  lidocaine viscous (XYLOCAINE) 2 % mouth solution 5 mL   Every 6  hours PRN     Route: Swish & Spit  Ordered Dose: 5 mL     Ordered Anita Aguilar    04/04/15 2152 04/04/15 2155  traMADol (ULTRAM) 50 mg (take home medication)   4 times daily PRN     Route: Oral  Ordered Dose: 50 mg     Ordered Anita Aguilar    04/04/15 2054 04/04/15 2053  acetaminophen (TYLENOL) tablet 1,000 mg   Once in ED     Route: Oral  Ordered Dose: 1,000 mg     Last MAR action:  Given Anita Aguilar             MDM  Number of Diagnoses or Management Options  Pain due to dental caries:   Risk of Complications, Morbidity, and/or Mortality  Presenting problems: low  Diagnostic procedures: minimal  Management options: low              Procedures    Clinical Impression & Disposition     Clinical Impression  Final diagnoses:   Pain due to dental caries        ED Disposition     Discharge Anita Aguilar discharge to home/self care.    Condition at disposition: Stable             New Prescriptions    CLINDAMYCIN (CLEOCIN) 300 MG CAPSULE    Take 1 capsule (300 mg total) by mouth 3 (three) times daily.    LIDOCAINE VISCOUS (XYLOCAINE) 2 % SOLUTION    Take 5 mLs by mouth every 6 (six) hours as needed for Pain.    TRAMADOL (ULTRAM) 50 MG TABLET    Take 1 tablet (50 mg total) by mouth every 6 (six) hours as needed for Pain.                   Anita Quinones, MD  04/04/15 [phone]

## 2015-04-04 NOTE — ED Notes (Signed)
Pt resting quietly, no needs or concerns at this time.

## 2015-04-04 NOTE — ED Notes (Signed)
Ice pack given for pain.

## 2015-04-04 NOTE — ED Notes (Signed)
Pt asking for pain medication and how much longer it will be before the MD sees her. Advised pt we will ask Dr. Guy Begin for pain medication and that someone will be in shortly to see her.

## 2015-04-04 NOTE — ED Notes (Signed)
Pt given verbal and written discharge instructions with 2 RX attached, 1 RX to [facility] and meds to go attached. Pt verbalized understanding of instructions with no questions or concerns at this time.

## 2015-04-04 NOTE — Discharge Instructions (Signed)
Dental Cavity    A dental cavity is a pit or crater in the surface of a tooth. This exposes the sensitive inner layer of the tooth and causes pain. If the cavity isn't treated, it will get bigger. It may cause an infection or abscess in the root of the tooth. An infection in the tooth is a much more serious problem than a cavity. You might need a root canal or the entire tooth taken out.  The pain in your tooth may be made worse by drinking hot or cold beverages. It may spread from the tooth to your ear or the area of your jaw on the same side.  Home care  Follow these tips when caring for yourself at home:   Avoid hot and cold foods and drinks. Your tooth may be sensitive to changes in temperature.   If your tooth is chipped or cracked, or if there is a large open cavity, put oil of cloves directly on the tooth to relieve pain. You can buy oil of cloves at drugstores. Some pharmacies carry an over-the-counter "toothache kit." This contains a paste that you can put on the exposed tooth to make it less sensitive.   Put a cold pack on your jaw over the sore area to help reduce pain.   You may use acetaminophen or ibuprofen to ease pain, unless another medicine was prescribed. Note: If you have chronic liver or kidney disease, talk with your health care provider before using these medicines. Also talk with your provider if you've had a stomach ulcer or GI bleeding.   If you have signs of an infection, you will be given an antibiotic. Take it as directed.  Follow-up care  Follow up with your dentist as advised. Your pain may go away with the treatment given today. But only a dentist can fully look at and treat this problem to prevent further tooth damage.  When to seek medical advice  Call your health care provider right awayif any of these occur:   Redness or swelling of the face   Pain gets worse or spreads to your neck   Fever over 100.5 F (38C)   Unusual drowsiness   Headache or stiff neck   Weakness  or fainting   Pus drains from the tooth or gum   Difficulty swallowing or breathing     2000-2015 [employer] [address], [place] [address]. All rights reserved. This information is not intended as a substitute for professional medical care. Always follow your healthcare professional's instructions.

## 2015-04-06 ENCOUNTER — Telehealth (RURAL_HEALTH_CENTER): Payer: Self-pay | Admitting: Family Medicine

## 2015-04-06 NOTE — Telephone Encounter (Signed)
She should contact her dentist.  If the swelling gets worse or if she develops fever, she needs to be re-evaluated by MD sooner.

## 2015-04-06 NOTE — Telephone Encounter (Signed)
Saw you in ER for abscess tooth. Has been on ABT but face and jaw still swollen. What should she do?

## 2015-04-07 NOTE — Telephone Encounter (Addendum)
Pt aware. Looking for dentist. Swelling has gone done this am

## 2015-05-03 ENCOUNTER — Other Ambulatory Visit (RURAL_HEALTH_CENTER): Payer: Self-pay | Admitting: Family

## 2015-05-10 ENCOUNTER — Inpatient Hospital Stay: Admission: RE | Admit: 2015-05-10 | Payer: PRIVATE HEALTH INSURANCE | Source: Ambulatory Visit

## 2015-05-31 ENCOUNTER — Other Ambulatory Visit (RURAL_HEALTH_CENTER): Payer: Self-pay | Admitting: Family Medicine

## 2015-06-05 ENCOUNTER — Encounter (RURAL_HEALTH_CENTER): Payer: Self-pay | Admitting: Family

## 2015-06-05 ENCOUNTER — Ambulatory Visit (RURAL_HEALTH_CENTER): Payer: PRIVATE HEALTH INSURANCE | Admitting: Family

## 2015-06-05 VITALS — BP 128/94 | HR 92 | Temp 98.5°F | Resp 16 | Ht 64.02 in | Wt 234.0 lb

## 2015-06-05 DIAGNOSIS — J020 Streptococcal pharyngitis: Secondary | ICD-10-CM

## 2015-06-05 MED ORDER — ALPRAZOLAM 0.25 MG PO TABS
ORAL_TABLET | ORAL | Status: DC
Start: ? — End: 2015-06-05

## 2015-06-05 MED ORDER — AZITHROMYCIN 250 MG PO TABS
ORAL_TABLET | ORAL | Status: DC
Start: ? — End: 2015-06-05

## 2015-06-05 NOTE — Progress Notes (Signed)
Subjective:    Patient ID: Anita Aguilar is a 45 y.o. female.    HPI    Patient presents today for evaluation of severe sore throat and nasal congestion 3 days.  No fever, vomiting, or diarrhea. + Sick contacts.    The patient has reduced her cigarette use to maybe 3-5 cigarettes daily, sometimes less. She tells me she can notice a positive change in her breathing.    Patient also requests refill on her alprazolam today.    Review of Systems   Constitutional: Negative for fever.   HENT: Positive for congestion and sore throat.    Respiratory: Negative for cough and shortness of breath.    Cardiovascular: Negative for chest pain.   Gastrointestinal: Negative for vomiting and diarrhea.   Skin: Negative for rash.   All other systems reviewed and are negative.          Objective:    Physical Exam   Constitutional: She is oriented to person, place, and time. She appears well-developed and well-nourished.   HENT:   Head: Normocephalic and atraumatic.   Right Ear: A middle ear effusion is present.   Left Ear: A middle ear effusion is present.   Nose: Rhinorrhea present.   Mouth/Throat: Uvula is midline. Oropharyngeal exudate and posterior oropharyngeal erythema present. No posterior oropharyngeal edema.   Neck: Normal range of motion. Neck supple.   Cardiovascular: Normal rate, regular rhythm and normal heart sounds.    Pulmonary/Chest: Effort normal and breath sounds normal. She has no wheezes. She has no rales.   Lymphadenopathy:     She has cervical adenopathy.   Neurological: She is alert and oriented to person, place, and time.   Skin: Skin is warm and dry.   Psychiatric: She has a normal mood and affect.           Assessment:       Strep pharyngitis  Medication refill  Plan:       The patient meets [facility] criteria for empiric treatment of strep pharyngitis.    zithromax    Work note    Replace toothbrush upon completion of antibiotics to prevent reinfection.    Refill alprazolam    Patient will return if her  symptoms worsen or fail to improve.

## 2015-06-05 NOTE — Progress Notes (Signed)
reviewed and agree with treatment plan

## 2015-06-28 ENCOUNTER — Inpatient Hospital Stay: Admission: RE | Admit: 2015-06-28 | Payer: PRIVATE HEALTH INSURANCE | Source: Ambulatory Visit

## 2015-07-13 ENCOUNTER — Other Ambulatory Visit (RURAL_HEALTH_CENTER): Payer: Self-pay | Admitting: Family

## 2015-08-16 ENCOUNTER — Other Ambulatory Visit (RURAL_HEALTH_CENTER): Payer: Self-pay | Admitting: Family

## 2015-09-21 ENCOUNTER — Encounter (RURAL_HEALTH_CENTER): Payer: Self-pay | Admitting: Family

## 2015-09-21 ENCOUNTER — Ambulatory Visit (RURAL_HEALTH_CENTER): Payer: PRIVATE HEALTH INSURANCE | Admitting: Family

## 2015-09-21 VITALS — BP 130/100 | HR 80 | Temp 98.2°F | Resp 16 | Ht 64.02 in | Wt 233.0 lb

## 2015-09-21 DIAGNOSIS — G894 Chronic pain syndrome: Secondary | ICD-10-CM

## 2015-09-21 DIAGNOSIS — M545 Low back pain, unspecified: Secondary | ICD-10-CM

## 2015-09-21 DIAGNOSIS — F329 Major depressive disorder, single episode, unspecified: Secondary | ICD-10-CM

## 2015-09-21 DIAGNOSIS — R350 Frequency of micturition: Secondary | ICD-10-CM

## 2015-09-21 DIAGNOSIS — M797 Fibromyalgia: Secondary | ICD-10-CM

## 2015-09-21 LAB — VH POCT URINALYSIS (DIPSTICK)
Bilirubin, UA POCT: NEGATIVE
Blood, UA POCT: NEGATIVE
Glucose, UA POCT: NEGATIVE mg/dL
Ketones, UA POCT: NEGATIVE mg/dL
Nitrite, UA POCT: NEGATIVE
POCT Leukocytes, UA: NEGATIVE
POCT Spec Gravity, UA: 1.015 (ref 1.001–1.035)
POCT pH, UA: 5 (ref 5–8)
Protein, UA POCT: NEGATIVE mg/dL
Urobilinogen, UA: 0.2 mg/dL

## 2015-09-21 MED ORDER — PREDNISONE 20 MG PO TABS
ORAL_TABLET | ORAL | Status: DC
Start: ? — End: 2015-09-21

## 2015-09-21 MED ORDER — AMITRIPTYLINE HCL 10 MG PO TABS
ORAL_TABLET | ORAL | Status: DC
Start: ? — End: 2015-09-21

## 2015-09-21 NOTE — Progress Notes (Signed)
Subjective:    Patient ID: Anita Aguilar is a 45 y.o. female.    HPI Comments: The patient presents to the clinic with c/o low back pain.  She has had lower back pain for the past month with recent worsening of her pain.  She has a Hx of back pain that is chronic and she is unsure if this is her sciatica or a pulled muscle.  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  She reports that she can't lie flat in bed or in the right recumbent position.  She has dull pain that is constant, it does worsen with position changes after prolonged static periods.  She has had some tingling in the RLE.  She does report urinary frequency and incontinence (this is chronic in nature).  She also notes that her urine has a strong odor in the morning.  She denies any numbness, weakness, saddle paresthesia, dysuria, hematuria, fevers/chills, or change in bowels.    She has been using heat and tylenol for pain without much relief of pain.    She also expresses concern that she has had a lot of recent stress that she is not doing well with.  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  He was laid to rest yesterday.  She notes that when she takes her alprazolam this helps a lot, but she is concerned about taking this 3 times a day.  She reports trying multiple anxiolytics without much success.    Back Pain  This is a chronic (Acute exacerbation) problem. The current episode started 1 to 4 weeks ago (1 month ago). The problem has been gradually worsening since onset. The pain is present in the lumbar spine, sacro-iliac and gluteal. The quality of the pain is described as aching. The pain does not radiate. The pain is at a severity of 9/10. The pain is severe. The pain is worse during the night. The symptoms are aggravated by lying down, position and sitting. Associated symptoms include bladder incontinence (Chronic) and paresthesias (RLE). Pertinent negatives include no abdominal pain,  bowel incontinence, chest pain, dysuria, fever, headaches, leg pain, numbness, paresis, pelvic pain, perianal numbness, tingling, weakness or weight loss. Risk factors include obesity. She has tried heat, home exercises and analgesics for the symptoms. The treatment provided no relief.       The following portions of the patient's history were reviewed and updated as appropriate: allergies, current medications, past family history, past medical history, past social history, past surgical history and problem list.    Review of Systems   Constitutional: Negative for fever and weight loss.   Cardiovascular: Negative for chest pain.   Gastrointestinal: Negative for abdominal pain and bowel incontinence.   Genitourinary: Positive for bladder incontinence (Chronic) and frequency. Negative for dysuria, urgency, hematuria, flank pain, decreased urine volume, enuresis, difficulty urinating and pelvic pain.   Musculoskeletal: Positive for back pain and gait problem (R/t back pain).   Neurological: Positive for paresthesias (RLE). Negative for tingling, weakness, numbness and headaches.   Psychiatric/Behavioral: Positive for sleep disturbance. Negative for suicidal ideas and self-injury. The patient is nervous/anxious.            Objective:    Physical Exam   Constitutional: She is oriented to person, place, and time. She appears well-developed and well-nourished. She is cooperative.  Non-toxic appearance. She does not have a sickly appearance. She does not appear ill. No distress.  HENT:   Head: Normocephalic and atraumatic.   Cardiovascular: Normal rate, regular rhythm, S1 normal, S2 normal, normal heart sounds and intact distal pulses.  PMI is not displaced.  Exam reveals no gallop, no S3, no S4, no distant heart sounds and no friction rub.    No murmur heard.  Pulmonary/Chest: Effort normal and breath sounds normal. No accessory muscle usage. No respiratory distress. She has no decreased breath sounds. She has no wheezes.  She has no rhonchi. She has no rales. She exhibits no tenderness.   Musculoskeletal: She exhibits tenderness. She exhibits no edema.        Thoracic back: She exhibits bony tenderness. She exhibits normal range of motion, no tenderness, no swelling, no edema, no deformity, no laceration, no pain, no spasm and normal pulse.        Lumbar back: She exhibits decreased range of motion, tenderness, bony tenderness, pain and spasm. She exhibits no swelling, no edema, no deformity, no laceration and normal pulse.        Back:    There is bilateral tenderness in the gluteus minimus region.  There is left sided tenderness in the gluteus maximus.  She has a bilateral negative straight leg raise.  With left hip flexion there are muscle spasms of the gluteus minimus and maximus.   Neurological: She is alert and oriented to person, place, and time. She displays no Babinski's sign on the right side. She displays no Babinski's sign on the left side.   Reflex Scores:       Patellar reflexes are 2+ on the right side and 2+ on the left side.  She is able to perform heel walking and toe walking.   Skin: Skin is warm. No rash noted. She is not diaphoretic. No erythema. No pallor.   Psychiatric: Her behavior is normal. Judgment and thought content normal. Her mood appears anxious. She exhibits a depressed mood.   Patient is tearful during the H&P.   Nursing note and vitals reviewed.          Assessment:       1. Urinary frequency    2. Acute right-sided low back pain without sciatica    3. Fibromyalgia    4. Chronic pain syndrome    5. Reactive depression          Plan:       1. In office POC UA is negative.  2. I discussed with the patient about low back pain causation and what she can do at home during this acute exacerbation of her low back pain.  She is encouraged to continue to be active as tolerated.  She may use ice or heat to the area 3-4 times a day for about 15 minutes each time.  She may massage the area.  3. Prescription for  prednisone taper given.  4. I discussed with her about her fibromyalgia and her chronic pain.  She is encouraged to stay active and walk as tolerated to help manage her symptoms.  5. Prescription for amitriptyline 10 mg qhs x 1 week and increase to 20 mg qhs after the first week.  We did discuss the side effect profile.  6. Patient to f/u in the office in 4 weeks for medication evaluation.

## 2015-09-21 NOTE — Patient Instructions (Addendum)
Fibromyalgia  Fibromyalgia is a chronic condition.It causes pain and tenderness in connective tissues.This causes muscle pain. Often, there are also many tender areas throughout the body.Symptoms may also include stiffness and feelings of numbness and tingling. Symptoms may be worse upon waking up. They may increase with poor sleep, heavy activity, cold or damp weather, anxiety, or stress.  People with fibromyalgia often feel tired. They may have trouble sleeping. Other symptoms include morning stiffness, headaches, and painful menstrual periods. Some people have problems with thinking clearly and changes in memory.  The cause of fibromyalgia is not known.Symptoms are similar to that of other diseases.These include rheumatoid arthritis, low thyroid, chronic fatigue syndrome, and Lyme disease.In some cases, these diseases may occur together.  Fibromyalgia is often treated with medicines. You and your healthcare provider can discuss the medication plan that may work best for you.You may have to try more than one medicine or combination of medicines before you find what works for you.  Home care   If yourhealthcare providerhas prescribed or recommended medicines, take them as directed.   Rest as needed. Try to get enough sleep. If you have trouble sleeping, discuss this with your healthcare provider.   Be active. Regular exercise can help manage symptoms. Some options include walking, swimming, and biking. Strengthening exercises may also be helpful. Start an exercise program gradually. Talk to your healthcare provider about the best ways to be active.   Follow a healthy diet. Limit caffeine and alcohol. If you smoke, ask yourhealthcare provider for help to stop.   Notice how your body reacts to stress. Learn to listen to your body signals. This will help you take action before the stress becomes severe.   Learn relaxation techniques. Also consider joining a stress reduction program or  class.   Talk to your healthcare provider about trying complementary treatments. These include acupuncture, hypnosis, and biofeedback. Yoga and tai chi may be helpful.   Ask your healthcare provider about cognitive behavioral therapy (CBT). This type of counseling canhelp people with fibromyalgia cope better with their illness.  Follow-up care  Follow up with your healthcare provider or as advised by our staff. In many cases, fibromyalgia is best treated with a team approach. This may involve your primary care provider, a rheumatologist, a physical therapist, anda mental health professional.  For more information:National Institute of Arthritis and Musculoskeletal and Skin Diseases (NIAMS)www.niams.http://www.myers.net/ (513)332-4820  When to seek medical advice  Contact your healthcare provider if any of the following occurs:   Symptoms getting worse or new symptoms developing   You feel hopeless, helpless, or lose interest in day-to-day life  Date Last Reviewed: 09/12/2013   2000-2016 [employer] [address] [place], [place] [address]. All rights reserved. This information is not intended as a substitute for professional medical care. Always follow your healthcare professional's instructions.        Back Pain (Acute or Chronic)    Back pain is one of the most common problems. The good news is that most people feel better in 1 to 2 weeks, and most of the rest in 1 to 2 months. Most people can remain active.  People experience and describe pain differently; not everyone is the same.   The pain can be sharp, stabbing, shooting, aching, cramping or burning.   Movement, standing, bending, lifting, sitting, or walking may worsen pain.   It can be localized to one spot or area, or it can be more generalized.   It can  spread or radiate upwards, to the front, or go down your arms or legs (sciatica).   It can cause muscle spasm.  Most of the time, mechanical problems with the musclesor spine cause the  pain. Mechanical problemsare usually caused by an injury to the muscles or ligaments. While illness can cause back pain, it is usually not caused by a serious illness. Mechanical problems include:   Physical activity such as sports, exercise, work, or normal activity   Overexertion, lifting, pushing, pulling incorrectly or too aggressively   Sudden twisting, bending, or stretching from an accident, or accidental movement   Poor posture   Stretching or moving wrong, without noticing pain at the time   Poor coordination, lack of regular exercise (check with your doctor about this)   Spinal disc disease or arthritis   Stress  Pain can also be related to pregnancy, or illness like appendicitis, bladder or kidney infections, pelvic infections, and many other things.  Acute back pain usually gets better in1 to 2 weeks. Back pain related to disk disease, arthritis in the spinal joints or spinal stenosis (narrowing of the spinal canal) can become chronic and last for months or years.  Unless you had a physical injury (for example, a car accident or fall) X-rays are usually not needed for the initial evaluation of back pain. If pain continues and does not respond to medical treatment, X-rays and other tests may be needed.  Home care  Try these home care recommendations:   When in bed, tryto find a position of comfort. A firm mattress is best. Try lying flat on your back with pillows under your knees. You can also try lying on your side with your knees bent up towards your chest and a pillow between your knees.   At first, do not try to stretch out the sore spots. If there is a strain, it is not like the good soreness you get after exercising without an injury. In this case, stretching may make it worse.   Avoid prolong sitting, long car rides, or travel. This puts more stress on the lower back than standing or walking.   During the first 24 to 72 hours after an acute injury or flare up of chronic back pain,  apply an ice pack to the painful area for 20 minutes and then remove it for 20 minutes. Do this over a period of 60 to 90 minutes or several times a day. This will reduce swelling and pain. Wrap the ice pack in a thin towel or plastic to protect your skin.   You can start with ice, then switch to heat. Heat (hot shower, hot bath, or heating pad) reduces pain and works well for muscle spasms. Heat can be applied to the painful area for 20 minutes then remove it for 20 minutes. Do this over a period of 60 to 90 minutes or several times a day. Do not sleep on a heating pad. It can lead to skin burns or tissue damage.   You can alternate ice and heat therapy. Talk with your doctor aboutthe best treatment for your back pain.   Therapeutic massage can help relax the back muscles without stretching them.   Be aware of safe lifting methods and do not lift anything without stretching first.  Medicines  Talk to your doctor before using medicine, especially if you have other medical problems or are taking other medicines.   You may use over-the-counter medicine as directed on the bottle to control  pain, unless another pain medicine was prescribed. If you have chronic conditions like diabetes, liver or kidney disease, stomach ulcers, or gastrointestinal bleeding, or are taking blood thinners, talk to your doctor before taking any medicine.   Be careful if you are given a prescription medicines, narcotics, or medicine for muscle spasms. They can cause drowsiness, affect your coordination, reflexes, and judgement. Do not drive or operate heavy machinery.  Follow-up care  Follow up with your healthcare provider, or as advised.  A radiologist will review any X-rays that were taken. Your provide will notify you of any new findings that may affect your care.  Call 911  Call emergency services if any of the following occur:   Trouble breathing   Confusion   Very drowsy or trouble awakening   Fainting or loss of  consciousness   Rapid or very slow heart rate   Loss of bowel or bladder control  When to seek medical advice  Call your healthcare provider right away if any of these occur:   Pain becomes worse or spreads to your legs   Weakness or numbness in one or both legs   Numbness in the groin or genital area  Date Last Reviewed: 11/18/2014   2000-2016 [employer] [address], [place], [place] [address]. All rights reserved. This information is not intended as a substitute for professional medical care. Always follow your healthcare professional's instructions.

## 2015-09-25 ENCOUNTER — Other Ambulatory Visit (RURAL_HEALTH_CENTER): Payer: Self-pay | Admitting: Family

## 2015-10-19 ENCOUNTER — Ambulatory Visit (RURAL_HEALTH_CENTER): Payer: PRIVATE HEALTH INSURANCE | Admitting: Family

## 2015-11-18 ENCOUNTER — Emergency Department
Admission: EM | Admit: 2015-11-18 | Discharge: 2015-11-18 | Disposition: A | Discharge status: Home / Self Care | Payer: PRIVATE HEALTH INSURANCE | Attending: Emergency Medicine | Admitting: Emergency Medicine

## 2015-11-18 ENCOUNTER — Emergency Department: Payer: PRIVATE HEALTH INSURANCE

## 2015-11-18 DIAGNOSIS — R51 Headache: Secondary | ICD-10-CM | POA: Insufficient documentation

## 2015-11-18 DIAGNOSIS — R112 Nausea with vomiting, unspecified: Secondary | ICD-10-CM | POA: Insufficient documentation

## 2015-11-18 DIAGNOSIS — R110 Nausea: Secondary | ICD-10-CM

## 2015-11-18 DIAGNOSIS — R42 Dizziness and giddiness: Secondary | ICD-10-CM | POA: Insufficient documentation

## 2015-11-18 DIAGNOSIS — R8299 Other abnormal findings in urine: Secondary | ICD-10-CM | POA: Insufficient documentation

## 2015-11-18 LAB — VH URINALYSIS WITH MICROSCOPIC AND CULTURE IF INDICATED
Bilirubin, UA: NEGATIVE mg/dL
Glucose, UA: NEGATIVE mg/dL
Leukocyte Esterase, UA: NEGATIVE Leu/uL
Nitrite, UA: NEGATIVE
RBC, UA: NONE SEEN /hpf
Urine Specific Gravity: 1.015 (ref 1.001–1.040)
Urobilinogen, UA: 1 mg/dL — AB
pH, Urine: 7 pH (ref 5.0–8.0)

## 2015-11-18 MED ORDER — MECLIZINE HCL 25 MG PO TABS
ORAL_TABLET | ORAL | Status: AC
Start: 2015-11-18 — End: ?
  Filled 2015-11-18: qty 2

## 2015-11-18 MED ORDER — MECLIZINE HCL 25 MG PO TABS
25.0000 mg | ORAL_TABLET | Freq: Once | ORAL | Status: AC
Start: 2015-11-18 — End: 2015-11-18
  Administered 2015-11-18: 25 mg via ORAL

## 2015-11-18 MED ORDER — PROCHLORPERAZINE EDISYLATE 5 MG/ML IJ SOLN
10.0000 mg | Freq: Once | INTRAMUSCULAR | Status: AC
Start: 2015-11-18 — End: 2015-11-18
  Administered 2015-11-18: 10 mg via INTRAVENOUS

## 2015-11-18 MED ORDER — SODIUM CHLORIDE 0.9 % IV BOLUS
1000.0000 mL | Freq: Once | INTRAVENOUS | Status: AC
Start: 2015-11-18 — End: 2015-11-18
  Administered 2015-11-18: 1000 mL via INTRAVENOUS

## 2015-11-18 MED ORDER — PROCHLORPERAZINE EDISYLATE 5 MG/ML IJ SOLN
INTRAMUSCULAR | Status: AC
Start: 2015-11-18 — End: ?
  Filled 2015-11-18: qty 2

## 2015-11-18 MED ORDER — MECLIZINE HCL 25 MG PO TABS
25.0000 mg | ORAL_TABLET | Freq: Four times a day (QID) | ORAL | Status: DC | PRN
Start: 2015-11-18 — End: 2016-02-06

## 2015-11-18 NOTE — ED Provider Notes (Signed)
Physician/Midlevel provider first contact with patient: 11/18/15 1704         History     Chief Complaint   Patient presents with   . Flank Pain     RT   . Headache   . Nausea   . Emesis   . Dizziness     HPI Comments: Anita Aguilar is a 45 yo female who presents to the ED with concern for right sided low back pain, vertigo, nausea and vomiting.  She reports that her low back pain began about 2 weeks ago and she has also had dull intermittent, frontal headaches for the past 2 weeks as well.  She reports that she has a long (several years) history of vertigo but typically experiences this at night before bed.  She does note that laying her right side dose seem to illicit the vertigo.  She reports that she had vertigo during the day today which was atypical.  She also reports that this morning she developed significant nausea and emesis which is atypical.  She does not feel that the nausea is the result of the vertigo as it began before her vertigo this morning.  She reports chronic tinnitus but denies any hearing loss.  She denies any dysuria, hematuria, increased frequency or urgency, she does report malodorous urine.  She denies any fever, chills, diarrhea, or abdominal pain.         The history is provided by the patient.            Past Medical History   Diagnosis Date   . Anxiety    . Fibromyalgia        Past Surgical History   Procedure Laterality Date   . Tubal ligation     . Knee surgery     . Cholecystectomy     . Cardiac catheterization  2007       No family history on file.    Social  Social History   Substance Use Topics   . Smoking status: Current Every Day Smoker -- 1.00 packs/day     Types: Cigarettes   . Smokeless tobacco: Never Used   . Alcohol Use: No       .     Allergies   Allergen Reactions   . Asa [Aspirin] Anaphylaxis   . Ativan [Lorazepam]    . Codeine    . Klonopin [Clonazepam]    . Morphine    . Penicillins    . Zoloft [Sertraline]        Home Medications     Last Medication Reconciliation  Action:  Complete Marjo Bicker, RN 11/18/2015  4:53 PM                  ALPRAZolam (XANAX) 0.25 MG tablet     TAKE 1 TABLET BY MOUTH THREE TIMES DAILY AS NEEDED                               Review of Systems   Constitutional: Negative for fever and chills.   HENT: Negative for congestion, postnasal drip, rhinorrhea, sinus pressure and sore throat.    Respiratory: Negative for cough, chest tightness, shortness of breath and wheezing.    Cardiovascular: Negative for chest pain and leg swelling.   Gastrointestinal: Positive for nausea and vomiting. Negative for abdominal pain, diarrhea, constipation and blood in stool.   Genitourinary: Negative for dysuria, urgency, frequency, hematuria and  difficulty urinating.   Skin: Negative for color change and rash.   Neurological: Positive for dizziness (vertigo) and headaches. Negative for weakness and numbness.       Physical Exam    BP: (!) 150/104 mmHg, Heart Rate: (!) 110, Temp: 98.6 F (37 C), Resp Rate: 18, SpO2: 98 %, Weight: 106.595 kg    Physical Exam   Constitutional: She is oriented to person, place, and time. She appears well-developed and well-nourished.   HENT:   Head: Normocephalic and atraumatic.   Eyes: EOM are normal. Pupils are equal, round, and reactive to light.   Neck: Normal range of motion. Neck supple.   Pulmonary/Chest: Effort normal.   Abdominal: Soft. Bowel sounds are normal. She exhibits no distension and no mass. There is no tenderness. There is no rebound and no guarding.   No CVAT   Musculoskeletal: Normal range of motion.   Lymphadenopathy:     She has no cervical adenopathy.   Neurological: She is alert and oriented to person, place, and time. She has normal reflexes.   Skin: Skin is warm and dry. No rash noted. No erythema.   Psychiatric: She has a normal mood and affect. Her behavior is normal.   Nursing note and vitals reviewed.        MDM and ED Course     ED Medication Orders     Start Ordered     Status Ordering Provider    11/18/15  1842 11/18/15 1841  meclizine (ANTIVERT) 25 mg (take home medication)   Once     Route: Oral  Ordered Dose: 25 mg     Last MAR action:  Meds to Go - ED Use Only Kaydan Wong A    11/18/15 1840 11/18/15 1839  meclizine (ANTIVERT) tablet 25 mg   Once     Route: Oral  Ordered Dose: 25 mg     Last MAR action:  Given Rashay Barnette A    11/18/15 1739 11/18/15 1738  sodium chloride 0.9 % bolus 1,000 mL   Once in ED     Route: Intravenous  Ordered Dose: 1,000 mL     Last MAR action:  Stopped Zyanne Schumm A    11/18/15 1733 11/18/15 1732  prochlorperazine (COMPAZINE) injection 10 mg   Once in ED     Route: Intravenous  Ordered Dose: 10 mg     Last MAR action:  Given Jaken Fregia A         Results     Procedure Component Value Units Date/Time    Urinalysis w Microscopic and Culture if Indicated [[id number]]  (Abnormal) Collected:  11/18/15 1706    Specimen Information:  Urine, Random Updated:  11/18/15 1716     Color, UA Yellow      Clarity, UA Clear      Specific Gravity, UR 1.015      pH, Urine 7.0 pH      Protein, UR 1+ (A) mg/dL      Glucose, UA Negative mg/dL      Ketones UA Trace (A) mg/dL      Bilirubin, UA Negative mg/dL      Blood, UA Trace (A) mg/dL      Nitrite, UA Negative      Urobilinogen, UA 1.0 (A) mg/dL      Leukocyte Esterase, UA Negative Leu/uL      UR Micro Performed      WBC, UA 3-4 /hpf      RBC, UA None Seen /  hpf      Bacteria, UA Few (A) /hpf      Squam Epithel, UA 40-50 (A) /lpf             MDM    UA does not reveal evidence of UTI and while there is trace blood on the dip, microscopic exam does not reveal any RBCs.  The patient reports significant improvement in her nausea and vertigo following compazine.  She reports further improvement with meclizine.  I suspect that this is an acute exacerbation of what sounds like BPPV with nausea and vomiting secondarily.  I feel that she would benefit from PT eval for Epley maneuver but will defer referral to PCP.  Follow up with PCP.  D/c with  antivert rx.  Plenty of fluids and rest, work note provided.         Procedures    Clinical Impression & Disposition     Clinical Impression  Final diagnoses:   Vertigo   Nausea        ED Disposition     Discharge Afrika Brick Cadavid discharge to home/self care.    Condition at disposition: Stable             Discharge Medication List as of 11/18/2015  6:41 PM      START taking these medications    Details   meclizine (ANTIVERT) 25 MG tablet Take 1 tablet (25 mg total) by mouth every 6 (six) hours as needed for Dizziness., Starting 11/18/2015, Until Discontinued, Normal                         Neva Seat, [place]  11/18/15 2349

## 2015-11-18 NOTE — ED Notes (Signed)
RT FLANK PAIN FOR 1 MONTH. DIZZINESS-CHRONIC-H/O VERTIGO. N/V,HA

## 2015-11-18 NOTE — Discharge Instructions (Signed)
Benign Paroxysmal Positional Vertigo  Benign paroxysmal positional vertigo (BPPV) is a problem with the inner ear. The inner ear contains the vestibular system. This system is what helps you keep your balance. BPPV causes a feeling of spinning. It is a common problem of the vestibular system.  Understanding the vestibular system  The vestibular system of the ear is made up of very tiny parts. They include the utricle, saccule, and semicircular canals. The utricle is a tiny organ that contains calcium crystals. In some people, the crystals can move into the semicircular canals. When this happens, the system no longer works as it should. This causes BPPV. Benign means it is not life-threatening. Paroxysmal means it happens suddenly. Positional means that it happens when you move your head. Vertigo is a feeling of spinning.  What causes BPPV?  Causes include injury to your head or neck. Other problems with the vestibular system may cause BPPV. In many people, the cause of BPPV is not known.  Symptoms of BPPV  You many have repeated feelings of spinning (vertigo). The vertigo usually lasts less than 1 minute. Some movements, suchas rolling over in bed, can bring on vertigo.  Diagnosing BPPV  Your primary health care provider may diagnose and treat your BPPV. Or you may see an ear, nose, and throat doctor (otolaryngologist). In some cases, you may see a nervous system doctor (neurologist).  The health care provider will ask about your symptoms and your medical history. He or she will examine you. You may have hearing and balance tests. As part of the exam, your health care provider may have you move your head and body in certain ways. If you have BPPV, the movements can bring on vertigo. Your provider will also look for abnormal movements of your eyes. You may have other tests to check your vestibular or nervous systems.  Treatment for BPPV  Your health care provider may try to move the calcium crystals. This is done  by having you move your head and neck in certain ways. This treatment is safe and often works well. You may also be told to do these movements at home. You may still have vertigo for a few weeks. Your health care provider will recheck your symptoms, usually in about a month. Special physical therapy may also be part of treatment. In rare cases surgery may be needed for BPPV that does not go away.    When to call the health care provider  Call your health care provider right away if you have any of these:   Symptoms that do not go away with treatment   Symptoms that get worse   New symptoms   Date Last Reviewed: 08/05/2013   2000-2016 [employer] [address], [place] [address]. All rights reserved. This information is not intended as a substitute for professional medical care. Always follow your healthcare professional's instructions.

## 2015-11-23 ENCOUNTER — Ambulatory Visit (RURAL_HEALTH_CENTER): Payer: PRIVATE HEALTH INSURANCE | Admitting: Family

## 2015-11-23 ENCOUNTER — Encounter (RURAL_HEALTH_CENTER): Payer: Self-pay | Admitting: Family

## 2015-11-23 VITALS — BP 144/90 | HR 88 | Temp 98.5°F | Resp 16 | Ht 64.02 in | Wt 241.0 lb

## 2015-11-23 DIAGNOSIS — J0110 Acute frontal sinusitis, unspecified: Secondary | ICD-10-CM

## 2015-11-23 DIAGNOSIS — R42 Dizziness and giddiness: Secondary | ICD-10-CM

## 2015-11-23 DIAGNOSIS — H6591 Unspecified nonsuppurative otitis media, right ear: Secondary | ICD-10-CM

## 2015-11-23 DIAGNOSIS — H9201 Otalgia, right ear: Secondary | ICD-10-CM

## 2015-11-23 DIAGNOSIS — F17210 Nicotine dependence, cigarettes, uncomplicated: Secondary | ICD-10-CM

## 2015-11-23 MED ORDER — FLUTICASONE PROPIONATE 50 MCG/ACT NA SUSP
2.0000 | Freq: Every day | NASAL | Status: DC
Start: ? — End: 2015-11-23

## 2015-11-23 MED ORDER — ANTIPYRINE-BENZOCAINE 5.4-1.4 % OT SOLN
3.0000 [drp] | OTIC | Status: DC | PRN
Start: ? — End: 2015-11-23

## 2015-11-23 MED ORDER — DIAZEPAM 2 MG PO TABS
2.0000 mg | ORAL_TABLET | Freq: Three times a day (TID) | ORAL | Status: DC | PRN
Start: ? — End: 2015-11-23

## 2015-11-23 NOTE — Patient Instructions (Addendum)
Do not take valium with you xanax.  Do not drive after taking valium.      Managing Dizziness (Vertigo) with Medicines    Although medicines can't cure your problem, they can help control symptoms. Your doctor may prescribe medicines for a few weeks and then taper them off. Always take your medicine as prescribed. Never share your medicine with others.  Contact your healthcare provider right away if you have side effects from your medicines.   How medicines can help   Treat infection or inflammation. If you have an infection caused by bacteria, your doctor can prescribe antibiotics.   Limit conflicting balance signals. These medicines are often in pill form.   Ease nausea. Suppositories, pills, or shots can reduce vomiting.   Reduce pressure in the canals. Diuretics can be used to treat Meniere's disease. These medicines help your body get rid of extra fluid.   Ease other symptoms. Other medicines can help ease depression and anxiety caused by living with dizziness or fainting.  Date Last Reviewed: 03/21/2015   2000-2016 [employer] [address] [place], [place] [address]. All rights reserved. This information is not intended as a substitute for professional medical care. Always follow your healthcare professional's instructions.

## 2015-11-23 NOTE — Progress Notes (Signed)
reviewed and agree with treatment plan

## 2015-11-23 NOTE — Progress Notes (Signed)
Subjective:    Patient ID: Anita Aguilar is a 45 y.o. female.    HPI Comments: The patient presents to the clinic with continued dizziness.  She was seen in the ED on 11/18/15.  She was given a course of meclizine which she has been taking as directed without relief of her symptoms.  With movement this worsens the dizziness.  If she lays on the right side she has increased dizziness.  She has had dizziness for about 1 year with a sudden onset.  She typically has this more at night, but her dizziness currently persists throughout the daytime.  She has a right temporal/frontal headache with the exacerbation that began on 11/18/15.  She also c/o right otalgia and tinnitus.  She has a family Hx of vertigo.  She had a head CT in June 2016, but has not had a MRI to further evaluate.    She is a current PPD smoker.    Dizziness  This is a chronic problem. The current episode started in the past 7 days (11/18/15). The problem occurs constantly. The problem has been unchanged. Associated symptoms include headaches (Right temporal; tingling), nausea, neck pain (Right sided) and vertigo (Spinning). Pertinent negatives include no abdominal pain, anorexia, arthralgias, change in bowel habit, chest pain, chills, congestion, coughing, fatigue, fever, joint swelling, myalgias, numbness, rash, sore throat, swollen glands, urinary symptoms, visual change, vomiting or weakness.   Otalgia   There is pain in the right ear. This is a new problem. The current episode started in the past 7 days. The problem occurs constantly. The problem has been unchanged. There has been no fever. The pain is at a severity of 4/10. The pain is mild. Associated symptoms include headaches (Right temporal; tingling) and neck pain (Right sided). Pertinent negatives include no abdominal pain, coughing, diarrhea, ear discharge, hearing loss, rash, rhinorrhea, sore throat or vomiting.       The following portions of the patient's history were reviewed and  updated as appropriate: allergies, current medications, past family history, past medical history, past social history, past surgical history and problem list.    Review of Systems   Constitutional: Negative for fever, chills and fatigue.   HENT: Positive for ear pain and tinnitus. Negative for congestion, ear discharge, hearing loss, rhinorrhea, sinus pressure, sneezing and sore throat.    Respiratory: Negative for cough.    Cardiovascular: Negative for chest pain.   Gastrointestinal: Positive for nausea. Negative for vomiting, abdominal pain, diarrhea, anorexia and change in bowel habit.   Musculoskeletal: Positive for neck pain (Right sided). Negative for myalgias, joint swelling and arthralgias.   Skin: Negative for rash.   Neurological: Positive for dizziness, vertigo (Spinning) and headaches (Right temporal; tingling). Negative for weakness and numbness.           Objective:    Physical Exam   Constitutional: She is oriented to person, place, and time. Vital signs are normal. She appears well-developed and well-nourished.  Non-toxic appearance. She does not have a sickly appearance. She does not appear ill. No distress.   HENT:   Head: Normocephalic and atraumatic.   Right Ear: Hearing, external ear and ear canal normal. Tympanic membrane is retracted. Tympanic membrane is not injected, not scarred, not perforated, not erythematous and not bulging. A middle ear effusion is present.   Left Ear: Hearing, tympanic membrane, external ear and ear canal normal. Tympanic membrane is not injected, not scarred, not perforated, not erythematous, not retracted and not bulging.  No  middle ear effusion.   Nose: Mucosal edema (Mild erythema without lesions or polyps) and rhinorrhea present. Right sinus exhibits frontal sinus tenderness. Right sinus exhibits no maxillary sinus tenderness. Left sinus exhibits no maxillary sinus tenderness and no frontal sinus tenderness.   Mouth/Throat: Uvula is midline and mucous membranes  are normal. Mucous membranes are not pale, not dry and not cyanotic. Posterior oropharyngeal erythema (Injected with cobblestoning) present. No oropharyngeal exudate, posterior oropharyngeal edema or tonsillar abscesses.   Eyes: Conjunctivae and EOM are normal. Pupils are equal, round, and reactive to light. Right eye exhibits no discharge. Left eye exhibits no discharge.   Neck: Neck supple. No JVD present. Muscular tenderness (Right trapezius) present. No rigidity. Decreased range of motion (D/t pain) present. No tracheal deviation, no edema and no erythema present.   Cardiovascular: Normal rate, regular rhythm, S1 normal, S2 normal, normal heart sounds and intact distal pulses.  PMI is not displaced.  Exam reveals no gallop, no S3, no S4, no distant heart sounds and no friction rub.    No murmur heard.  Pulses:       Carotid pulses are 2+ on the right side, and 2+ on the left side.       Radial pulses are 2+ on the right side, and 2+ on the left side.   Pulmonary/Chest: Effort normal and breath sounds normal. No accessory muscle usage or stridor. No respiratory distress. She has no decreased breath sounds. She has no wheezes. She has no rhonchi. She has no rales. She exhibits no tenderness.   Musculoskeletal: She exhibits tenderness. She exhibits no edema.   Lymphadenopathy:        Head (right side): No submental, no submandibular, no tonsillar, no preauricular, no posterior auricular and no occipital adenopathy present.        Head (left side): No submental, no submandibular, no tonsillar, no preauricular, no posterior auricular and no occipital adenopathy present.     She has no cervical adenopathy.        Right: No supraclavicular adenopathy present.        Left: No supraclavicular adenopathy present.   Neurological: She is alert and oriented to person, place, and time. She has normal reflexes. She is not disoriented. She displays no atrophy, no tremor and normal reflexes. No cranial nerve deficit or sensory  deficit. She exhibits normal muscle tone. She displays no seizure activity. Coordination and gait normal. GCS eye subscore is 4. GCS verbal subscore is 5. GCS motor subscore is 6.   Skin: Skin is warm and dry. No rash noted. She is not diaphoretic. No erythema. No pallor.   Psychiatric: She has a normal mood and affect. Her behavior is normal. Judgment and thought content normal.   Nursing note and vitals reviewed.          Assessment:       1. Vertigo    2. Otalgia, right    3. Middle ear effusion, right    4. Acute non-recurrent frontal sinusitis    5. Moderate smoker (20 or less per day)          Plan:       1. I have discussed with the patient about the different causes of dizziness.  She is to make position changes slowly and focus on one point.  She should also perform RICE of her neck and right shoulder area.  2. Prescription for valium 2 mg TID PRN dizziness.  She is aware that she should not take  this with her xanax.  I have discussed the side effect profile with the patient and she is aware that she should not drive.  3. Prescription for auralgan  4. Prescription for flonase  5. I have ordered a MRI of her brain with contrast.  6. Referral to ENT given.  7. Patient to f/u PRN if no improvement or worsening of symptoms.

## 2015-12-04 ENCOUNTER — Other Ambulatory Visit (RURAL_HEALTH_CENTER): Payer: Self-pay | Admitting: Family

## 2015-12-05 ENCOUNTER — Ambulatory Visit (INDEPENDENT_AMBULATORY_CARE_PROVIDER_SITE_OTHER): Payer: PRIVATE HEALTH INSURANCE | Admitting: Otolaryngology

## 2015-12-06 ENCOUNTER — Other Ambulatory Visit: Payer: PRIVATE HEALTH INSURANCE

## 2016-01-03 ENCOUNTER — Other Ambulatory Visit (RURAL_HEALTH_CENTER): Payer: Self-pay | Admitting: Family

## 2016-01-31 ENCOUNTER — Emergency Department
Admission: EM | Admit: 2016-01-31 | Discharge: 2016-01-31 | Disposition: A | Discharge status: Home / Self Care | Payer: PRIVATE HEALTH INSURANCE | Attending: Family Medicine | Admitting: Family Medicine

## 2016-01-31 ENCOUNTER — Emergency Department: Payer: PRIVATE HEALTH INSURANCE

## 2016-01-31 DIAGNOSIS — X58XXXA Exposure to other specified factors, initial encounter: Secondary | ICD-10-CM | POA: Insufficient documentation

## 2016-01-31 DIAGNOSIS — S335XXA Sprain of ligaments of lumbar spine, initial encounter: Secondary | ICD-10-CM | POA: Insufficient documentation

## 2016-01-31 LAB — VH URINALYSIS WITH MICROSCOPIC AND CULTURE IF INDICATED
Bilirubin, UA: NEGATIVE mg/dL
Glucose, UA: NEGATIVE mg/dL
Ketones UA: NEGATIVE mg/dL
Leukocyte Esterase, UA: NEGATIVE Leu/uL
Nitrite, UA: NEGATIVE
Protein, UR: NEGATIVE mg/dL
Urine Specific Gravity: 1.01 (ref 1.001–1.040)
Urobilinogen, UA: 0.2 mg/dL
WBC, UA: NONE SEEN /hpf
pH, Urine: 6 pH (ref 5.0–8.0)

## 2016-01-31 MED ORDER — GABAPENTIN 300 MG PO CAPS
ORAL_CAPSULE | ORAL | Status: AC
Start: 2016-01-31 — End: ?
  Filled 2016-01-31: qty 1

## 2016-01-31 MED ORDER — BACLOFEN 10 MG PO TABS
10.0000 mg | ORAL_TABLET | Freq: Three times a day (TID) | ORAL | 0 refills | Status: DC
Start: 2016-01-31 — End: 2016-02-06

## 2016-01-31 MED ORDER — PREDNISONE 10 MG PO TABS
ORAL_TABLET | ORAL | 0 refills | Status: DC
Start: 2016-01-31 — End: 2016-02-06

## 2016-01-31 MED ORDER — GABAPENTIN 300 MG PO CAPS
300.0000 mg | ORAL_CAPSULE | Freq: Three times a day (TID) | ORAL | Status: DC
Start: 2016-01-31 — End: 2016-01-31

## 2016-01-31 NOTE — ED Provider Notes (Signed)
EMERGENCY DEPARTMENT HISTORY AND PHYSICAL EXAM    Date: 01/31/16  Patient Name: Anita Aguilar  Attending Physician: Elmer Picker, MD      History of Presenting Illness     Chief Complaint:   Chief Complaint   Patient presents with   . Back Pain       Historian: Patient     45 y.o. female presents with worsening R mid back pain x 5 days. Patient states she's had back pain for several months, but has been in extreme discomfort over the past 5 days. Pain is described as a constant 10/10 tingling pressure, radiating to R anterior abdomen and RLQ. Patient states she's had a cholecystectomy. Tried ice, heat, stretching, and tylenol with no relief. Pain is increased with movement and coughing. Denies nausea, vomiting, diarrhea, numbness, urinary symptoms, fever or recent illness, chest pain, and SOB.          PMD:  Kalman Shan, NP           Past Medical History     Past Medical History:   Diagnosis Date   . Anxiety    . Fibromyalgia        Past Surgical History     Past Surgical History:   Procedure Laterality Date   . CARDIAC CATHETERIZATION  2007   . CHOLECYSTECTOMY     . KNEE SURGERY     . TUBAL LIGATION         Family History     No family history on file.    Social History     Social History     Social History   . Marital status: Married     Spouse name: N/A   . Number of children: N/A   . Years of education: N/A     Social History Main Topics   . Smoking status: Current Every Day Smoker     Packs/day: 1.00     Types: Cigarettes   . Smokeless tobacco: Never Used   . Alcohol use No   . Drug use: No   . Sexual activity: Not on file     Other Topics Concern   . Not on file     Social History Narrative   . No narrative on file       Allergies     Allergies   Allergen Reactions   . Asa [Aspirin] Anaphylaxis   . Ativan [Lorazepam]    . Codeine    . Klonopin [Clonazepam]    . Morphine    . Penicillins    . Zoloft [Sertraline]        Home Medications     Home medications reviewed by ED MD     Previous Medications     ALPRAZOLAM (XANAX) 0.25 MG TABLET    TAKE 1 TABLET BY MOUTH 3 TIMES A DAY    ANTIPYRINE-BENZOCAINE (AURALGAN) OTIC SOLUTION    Place 3 drops into the right ear every 2 (two) hours as needed for Pain.    FLUTICASONE (FLONASE) 50 MCG/ACT NASAL SPRAY    2 sprays by Nasal route daily.Use 2 Sprays into each nostril daily    MECLIZINE (ANTIVERT) 25 MG TABLET    Take 1 tablet (25 mg total) by mouth every 6 (six) hours as needed for Dizziness.       ED Medications Administered     ED Medication Orders     None            VS  Patient Vitals for the past 24 hrs:   BP Temp Temp src Pulse Resp SpO2 Height Weight   01/31/16 0817 (!) 157/108 98.3 F (36.8 C) Tympanic 92 16 100 % 1.702 m 109.3 kg       Review of Systems     Constitutional:  No fever, chills, or generalized weakness   Eyes:  ENT: No congestion, runny nose, or throat pain  CV:  No CP   Resp:  No cough or SOB  GI: No abd pain or N/V/D  GU: No urinary sxs   MS: No hip or neck pain. No decreased ROM  Skin: No rash   Neuro:  Dizziness and lightheadedness. H/O vertigo   Psych:  No behavior change   All other systems reviewed and negative      Physical Exam       Constitutional: Vital signs reviewed. Patient appears uncomfortable  Head: Normocephalic, atraumatic  Eyes: Conjunctiva and sclera are normal.  No injection or discharge.  ENT:  Moist mucus membranes  Neck: Normal range of motion. Trachea midline. No JVD. No cervical tenderness to palpation.   Respiratory/Chest: Clear to auscultation bilaterally. No respiratory distress.   Cardiovascular: Regular rate and rhythm. No murmurs.   Abdomen: Soft and non-tender. Normal bowel sounds. No masses.  No rebound or guarding. No RLQ point tenderness  Back:  No swelling or ecchymosis. Normal ROM. R sided pain with movement and palpation. No midline pain or tenderness to palpation. No deformities.   Upper Extremity:  No edema. No cyanosis. Normal ROM.    Lower Extremity:  No edema. No cyanosis. Normal ROM.   Neurological:   Alert and Oriented x3. Conversant.  No focal motor deficits by observation. Speech normal.  Skin: Warm and dry. No rash.  Psychiatric:  Normal affect.  Normal insight.        Diagnostic Study Results         Labs     Results     Procedure Component Value Units Date/Time    Urinalysis w Microscopic and Culture if Indicated [[id number]]  (Abnormal) Collected:  01/31/16 0900    Specimen:  Urine, Random Updated:  01/31/16 0924     Color, UA Yellow     Clarity, UA Clear     Specific Gravity, UR 1.010     pH, Urine 6.0 pH      Protein, UR Negative mg/dL      Glucose, UA Negative mg/dL      Ketones UA Negative mg/dL      Bilirubin, UA Negative mg/dL      Blood, UA Trace (A) mg/dL      Nitrite, UA Negative     Urobilinogen, UA 0.2 mg/dL      Leukocyte Esterase, UA Negative Leu/uL      UR Micro Performed     WBC, UA None Seen /hpf      RBC, UA 1-2 /hpf      Bacteria, UA Occasional (A) /hpf      Squam Epithel, UA 5-10 (A) /lpf           Radiologic Studies  Radiology Results (24 Hour)     Procedure Component Value Units Date/Time    CT Abdomen Pelvis dry  Larina Bras) [[id number]] Collected:  01/31/16 0919    Order Status:  Completed Updated:  01/31/16 0927    Narrative:       Clinical History:  Reason For Exam: FLANK PAIN, STONE KNOWN OR SUSPECTED  Acute onset of right  flank pain     Examination:  CT of the abdomen and pelvis without contrast. Multiplanar reconstructions obtained.    CT images were acquired utilizing Automated Exposure Control for dose reduction.     Comparison:  May 01, 2014    Findings:  The exam is technically limited by lack of intravenous contrast.    Lower chest:  Mild atelectatic changes are seen at the left base. No consolidation or pleural effusion is noted.    Abdomen:  No suspicious hepatic masses are identified. The gallbladder is surgically absent and there is a small amount of pneumobilia. No abnormal biliary ductal dilatation is seen.    The pancreas, spleen and adrenal glands are normal in  appearance, given the limitations of noncontrast imaging.    No contour deforming renal masses are identified. There is no hydronephrosis. No radiopaque renal stones are seen.    Evaluation of the GI tract is limited in the absence of oral contrast. There is no evidence of high-grade bowel obstruction. The appendix is normal in caliber. No bowel wall thickening is seen.     No free air, free fluid or pathologic adenopathy is noted.    The aorta and IVC are normal in course and caliber. A few scattered vascular calcifications are seen.    Pelvis:  The bladder is underdistended but grossly normal in appearance.    No abnormal adnexal masses are seen. The uterus is normal in appearance.    There is no bulky pelvic adenopathy by CT size criteria. No free fluid is identified.    Bones and Soft Tissues:  There are mild to moderate scattered bony degenerative changes without evidence of acute fracture. Slight dextrocurvature is noted with its apex at the thoracolumbar junction. No blastic or lytic bone lesions are seen.      Impression:       No acute intra-abdominal pathology is identified.  Postsurgical features of prior cholecystectomy and a small volume of pneumobilia are again seen.  Mild to moderate scattered bony degenerative changes.    ReadingStation:[id number]      .    Injury Mechanism Review             Data Review           Rendering Provider: Elmer Picker, MD      MDM and Clinical Notes           Notes:         Critical Care         Diagnosis and Disposition   Rendering Provider: Elmer Picker, MD    Clinical Impression(s):  1. Lumbar sprain, initial encounter        Disposition  ED Disposition     ED Disposition Condition Date/Time Comment    Discharge  Wed Jan 31, 2016 10:35 AM Kathreen Cosier Fauble discharge to home/self care.    Condition at disposition: Stable          Prescriptions    Discharge Medication List as of 01/31/2016 10:36 AM      START taking these medications    Details   baclofen (LIORESAL) 10 MG  tablet Take 1 tablet (10 mg total) by mouth 3 (three) times daily.Do not take with xanax., Starting Wed 01/31/2016, Normal      predniSONE (DELTASONE) 10 MG tablet 6x3, 4x3, 2x3, 1x3, Normal                   Elenor Quinones, MD  01/31/16 1056

## 2016-01-31 NOTE — Discharge Instructions (Signed)
Causes of Lumbar (Low Back) Pain  Low back pain can be caused by problems with any part of the lumbar spine. A disk can herniate (push out) and press on a nerve. Vertebrae can rub against each other or slip out of place. This can irritate facet joints and nerves. It can also lead to stenosis, a narrowing of the spinal canal or foramen.  Pressure from a disk  Constant wear and tear on a disk can cause it to weaken and push outward. Part of the disk may then press on nearby nerves. There are two common types of herniated disks:  Contained means the soft nucleus is protruding outward.   Extruded means the firm annulus has torn, letting the soft center squeeze through.     Pressure from bone An unstable spine   With age, a disk may thin and wear out. Vertebrae above and below the disk maybegin to touch. This can put pressure on nerves. It can also cause bone spurs (growths) to form where the bones rub together.    Stenosis results when bone spurs narrow the foramen or spinal canal. This also puts pressure on nerves. Slipping vertebrae can irritate nerves and joints. They can also worsen stenosis.    In some cases, vertebrae become unstable andslip forward. This is called spondylolisthesis.   Date Last Reviewed: 02/28/2014   2000-2016 [employer] [address], [place] [address]. All rights reserved. This information is not intended as a substitute for professional medical care. Always follow your healthcare professional's instructions.

## 2016-02-06 ENCOUNTER — Encounter (RURAL_HEALTH_CENTER): Payer: Self-pay | Admitting: Family

## 2016-02-06 ENCOUNTER — Ambulatory Visit (RURAL_HEALTH_CENTER): Payer: PRIVATE HEALTH INSURANCE | Admitting: Family

## 2016-02-06 VITALS — BP 150/90 | HR 88 | Temp 98.1°F | Resp 16 | Ht 64.02 in | Wt 236.0 lb

## 2016-02-06 DIAGNOSIS — M546 Pain in thoracic spine: Secondary | ICD-10-CM

## 2016-02-06 DIAGNOSIS — R2689 Other abnormalities of gait and mobility: Secondary | ICD-10-CM

## 2016-02-06 MED ORDER — CHLORZOXAZONE 500 MG PO TABS
500.0000 mg | ORAL_TABLET | Freq: Four times a day (QID) | ORAL | 0 refills | Status: DC | PRN
Start: ? — End: 2016-02-06

## 2016-02-06 MED ORDER — HYDROCODONE-ACETAMINOPHEN 5-325 MG PO TABS
1.0000 | ORAL_TABLET | Freq: Three times a day (TID) | ORAL | 0 refills | Status: DC | PRN
Start: ? — End: 2016-02-06

## 2016-02-06 NOTE — Progress Notes (Signed)
Subjective:    Patient ID: Anita Aguilar is a 45 y.o. female.    HPI     Pt presents today for f/u on a recent ER visit for right-sided mid back pain.  Pt was diagnosed with lumbar strain and prescribed baclofen and prednisone taper.  Pt states her pain persists and the medication has not helped.  No recent injury.  No saddle anesthesia or bowel/bladder incontinence.  Hx of fibromyalgia.      Review of Systems   Constitutional: Negative for fever.   Respiratory: Negative for shortness of breath.    Cardiovascular: Negative for chest pain.   Musculoskeletal: Positive for back pain.   All other systems reviewed and are negative.          Objective:    Physical Exam   Constitutional: She is oriented to person, place, and time. She appears well-developed and well-nourished.   + obese   HENT:   Head: Normocephalic and atraumatic.   Cardiovascular: Normal rate and regular rhythm.    Pulmonary/Chest: Effort normal and breath sounds normal.   Musculoskeletal:        Thoracic back: She exhibits decreased range of motion (due to pain) and tenderness (right-sided thoracic paraspinal musculature). She exhibits no swelling and no deformity.   Neurological: She is alert and oriented to person, place, and time. Gait (antalgic gait) abnormal.   Skin: Skin is warm and dry.   Psychiatric: She has a normal mood and affect.   Nursing note and vitals reviewed.          Assessment:       Acute right-sided thoracic back pain  Antalgic gait      Plan:       Stop baclofen    Switch to Parafon forte to see if this offers better pain relief.  Side effect profile discussed and all questions answered.    PMP reviewed and is unremarkable.  I will prescribe a small number of norco for the patient for this acute pain.  She understands not to combine this medication with the parafon forte or her regularly prescribed alprazolam. She understands no refills will be provided on this medication. Narcan will be prescribed as a precaution in  accordance with the latest pain management guidelines.     Rx for quad cane to [facility] per patient request    Follow up if symptoms worsen or fail to improve.

## 2016-02-07 ENCOUNTER — Other Ambulatory Visit (RURAL_HEALTH_CENTER): Payer: Self-pay | Admitting: Family

## 2016-02-07 MED ORDER — ALPRAZOLAM 0.25 MG PO TABS
ORAL_TABLET | ORAL | 0 refills | Status: DC
Start: ? — End: 2016-02-07

## 2016-02-07 MED ORDER — CLINDAMYCIN HCL 300 MG PO CAPS
300.0000 mg | ORAL_CAPSULE | Freq: Three times a day (TID) | ORAL | 0 refills | Status: DC
Start: ? — End: 2016-02-07

## 2016-02-07 NOTE — Telephone Encounter (Signed)
Pt also asking about ABT for tooth.

## 2016-02-12 MED ORDER — NALOXONE HCL 4 MG/0.1ML NA LIQD
NASAL | 0 refills | Status: DC
Start: ? — End: 2016-02-12

## 2016-02-13 NOTE — Progress Notes (Signed)
reviewed and agree with treatment plan

## 2016-03-18 ENCOUNTER — Emergency Department: Payer: PRIVATE HEALTH INSURANCE

## 2016-03-18 ENCOUNTER — Emergency Department
Admission: EM | Admit: 2016-03-18 | Discharge: 2016-03-18 | Disposition: A | Discharge status: Home / Self Care | Payer: PRIVATE HEALTH INSURANCE | Attending: Emergency Medicine | Admitting: Emergency Medicine

## 2016-03-18 DIAGNOSIS — M545 Low back pain: Secondary | ICD-10-CM

## 2016-03-18 DIAGNOSIS — W109XXA Fall (on) (from) unspecified stairs and steps, initial encounter: Secondary | ICD-10-CM | POA: Insufficient documentation

## 2016-03-18 DIAGNOSIS — G8929 Other chronic pain: Secondary | ICD-10-CM | POA: Insufficient documentation

## 2016-03-18 MED ORDER — METHYLPREDNISOLONE 4 MG PO TBPK
ORAL_TABLET | ORAL | 0 refills | Status: DC
Start: 2016-03-18 — End: 2016-05-20

## 2016-03-18 NOTE — Discharge Instructions (Signed)
Back Contusion    You have a contusion to your back. A contusion is also called a bruise. There is swelling and some bleeding under the skin. The skin may be purplish. You may have muscle aching and stiffness in the area of the bruise. There are no broken bones.  Contusions heal on their own, without further treatment. However, pain and skin discoloration may take weeks to months to go away.  Home care   Rest. Avoid heavy lifting, strenuous exertion, or any activity that causes pain.   Ice the area to reduce pain and swelling. Put ice cubes in a plastic bag or use a cold pack. (Wrap the cold source in a thin towel. Do not place it directly on your skin.) Ice the injured area for 20 minutes every 1-2 hours the first day. Continue with ice packs 3-4 times a day for the next two days, then as needed for the relief of pain and swelling.   Take any prescribed pain medication. If none was prescribed, take acetaminophen,ibuprofen,or naproxento control pain.  Follow-up care  Follow up with your healthcare provider, or as directed. Call if you are not better in 1-2 weeks.  When to seek medical advice  Call your healthcare provider for any of the following:   New or worsening pain   Increased swelling around the bruise   Pain spreads to one or both legs   Weakness or numbness in one or both legs   Loss of bowel or bladder control   Numbness in the groin or genital area   Fever of 100.4F (38C) or higher, or as directed by your healthcare provider  Date Last Reviewed: 11/12/2013   2000-2016 [employer] [address], [place] [address]. All rights reserved. This information is not intended as a substitute for professional medical care. Always follow your healthcare professional's instructions.          Back Pain (Acute or Chronic)    Back pain is one of the most common problems. The good news is that most people feel better in 1 to 2 weeks, and most of the rest in 1 to 2 months. Most  people can remain active.  People experience and describe pain differently; not everyone is the same.   The pain can be sharp, stabbing, shooting, aching, cramping or burning.   Movement, standing, bending, lifting, sitting, or walking may worsen pain.   It can be localized to one spot or area, or it can be more generalized.   It can spread or radiate upwards, to the front, or go down your arms or legs (sciatica).   It can cause muscle spasm.  Most of the time, mechanical problems with the musclesor spine cause the pain. Mechanical problemsare usually caused by an injury to the muscles or ligaments. While illness can cause back pain, it is usually not caused by a serious illness. Mechanical problems include:   Physical activity such as sports, exercise, work, or normal activity   Overexertion, lifting, pushing, pulling incorrectly or too aggressively   Sudden twisting, bending, or stretching from an accident, or accidental movement   Poor posture   Stretching or moving wrong, without noticing pain at the time   Poor coordination, lack of regular exercise (check with your doctor about this)   Spinal disc disease or arthritis   Stress  Pain can also be related to pregnancy, or illness like appendicitis, bladder or kidney infections, pelvic infections, and many other things.    Acute back pain usually gets better in1 to 2 weeks. Back pain related to disk disease, arthritis in the spinal joints or spinal stenosis (narrowing of the spinal canal) can become chronic and last for months or years.  Unless you had a physical injury (for example, a car accident or fall) X-rays are usually not needed for the initial evaluation of back pain. If pain continues and does not respond to medical treatment, X-rays and other tests may be needed.  Home care  Try these home care recommendations:   When in bed, tryto find a position of comfort. A firm mattress is best. Try lying flat on your back with pillows under your  knees. You can also try lying on your side with your knees bent up towards your chest and a pillow between your knees.   At first, do not try to stretch out the sore spots. If there is a strain, it is not like the good soreness you get after exercising without an injury. In this case, stretching may make it worse.   Avoid prolong sitting, long car rides, or travel. This puts more stress on the lower back than standing or walking.   During the first 24 to 72 hours after an acute injury or flare up of chronic back pain, apply an ice pack to the painful area for 20 minutes and then remove it for 20 minutes. Do this over a period of 60 to 90 minutes or several times a day. This will reduce swelling and pain. Wrap the ice pack in a thin towel or plastic to protect your skin.   You can start with ice, then switch to heat. Heat (hot shower, hot bath, or heating pad) reduces pain and works well for muscle spasms. Heat can be applied to the painful area for 20 minutes then remove it for 20 minutes. Do this over a period of 60 to 90 minutes or several times a day. Do not sleep on a heating pad. It can lead to skin burns or tissue damage.   You can alternate ice and heat therapy. Talk with your doctor aboutthe best treatment for your back pain.   Therapeutic massage can help relax the back muscles without stretching them.   Be aware of safe lifting methods and do not lift anything without stretching first.  Medicines  Talk to your doctor before using medicine, especially if you have other medical problems or are taking other medicines.   You may use over-the-counter medicine as directed on the bottle to control pain, unless another pain medicine was prescribed. If you have chronic conditions like diabetes, liver or kidney disease, stomach ulcers, or gastrointestinal bleeding, or are taking blood thinners, talk to your doctor before taking any medicine.   Be careful if you are given a prescription medicines,  narcotics, or medicine for muscle spasms. They can cause drowsiness, affect your coordination, reflexes, and judgement. Do not drive or operate heavy machinery.  Follow-up care  Follow up with your healthcare provider, or as advised.  A radiologist will review any X-rays that were taken. Your provide will notify you of any new findings that may affect your care.  Call 911  Call emergency services if any of the following occur:   Trouble breathing   Confusion   Very drowsy or trouble awakening   Fainting or loss of consciousness   Rapid or very slow heart rate   Loss of bowel or bladder control  When to seek medical advice    Call your healthcare provider right away if any of these occur:   Pain becomes worse or spreads to your legs   Weakness or numbness in one or both legs   Numbness in the groin or genital area  Date Last Reviewed: 11/18/2014   2000-2016 [employer]. [address], [place] [address]. All rights reserved. This information is not intended as a substitute for professional medical care. Always follow your healthcare professional's instructions.

## 2016-03-18 NOTE — ED Triage Notes (Signed)
Patient states "I fell down the steps last night and jarred everything."  Patient c/o pain right rib, hip and lower back.

## 2016-03-18 NOTE — ED Provider Notes (Signed)
[facility]   EMERGENCY DEPARTMENT HISTORY AND PHYSICAL EXAM    Date: 03/18/2016  Patient Name: Anita Aguilar  Attending Physician: Otelia Sergeant, MD  Patient DOB:  [birth year]  MRN:  [id number]  Room:  ED2/ED2-A    Patient was evaluated by ED physician,B. Marchelle Folks, MD at 11:49 AM    History     Chief Complaint   Patient presents with   . Back Pain       HPI:  The patient Anita Aguilar, is a 45 y.o. female who presents with chief complaint of back and right rib pain  States fell on steps last night  States has hx of LBP states had right upper flank pain last mont  CT scan was neg  no focal neuro symptoms  No other complaints    Location- see above  Severity-    Duration-    Radiation -   Character-    Onset-    Modifying-    Associated symptoms-     PCP:  Kalman Shan, NP      Past Medical History       Past Medical History:   Diagnosis Date   . Anxiety    . Fibromyalgia          Past Surgical History       Past Surgical History:   Procedure Laterality Date   . CARDIAC CATHETERIZATION  2007   . CHOLECYSTECTOMY     . KNEE SURGERY     . TUBAL LIGATION           Family History    History reviewed. No pertinent family history.    Social History    Social History     Social History   . Marital status: Married     Spouse name: N/A   . Number of children: N/A   . Years of education: N/A     Social History Main Topics   . Smoking status: Current Every Day Smoker     Packs/day: 1.00     Types: Cigarettes   . Smokeless tobacco: Never Used   . Alcohol use No   . Drug use: No   . Sexual activity: Not Asked     Other Topics Concern   . None     Social History Narrative   . None       Allergies    Allergies   Allergen Reactions   . Asa [Aspirin] Anaphylaxis   . Ativan [Lorazepam]    . Codeine    . Klonopin [Clonazepam]    . Morphine    . Penicillins    . Zoloft [Sertraline]          Current/Home Medications    Current/Home Medications    ALPRAZOLAM (XANAX) 0.25 MG TABLET    TAKE 1 TABLET BY MOUTH 3 TIMES  A DAY    CHLORZOXAZONE (PARAFON FORTE DSC) 500 MG TABLET    Take 1 tablet (500 mg total) by mouth 4 (four) times daily as needed for Muscle spasms.    FLUTICASONE (FLONASE) 50 MCG/ACT NASAL SPRAY    2 sprays by Nasal route daily.Use 2 Sprays into each nostril daily    HYDROCODONE-ACETAMINOPHEN (NORCO) 5-325 MG PER TABLET    Take 1 tablet by mouth every 8 (eight) hours as needed for Pain.    NALOXONE (NARCAN) 4 MG/0.1ML LIQUID NASAL SPRAY    For signs of opioid overdose.       Vital Signs  BP (!) 137/95   Pulse (!) 107   Temp 97.6 F (36.4 C) (Tympanic)   Resp 20   Ht 1.626 m   Wt 106.6 kg   SpO2 98%   BMI 40.34 kg/m   Patient Vitals for the past 24 hrs:   BP Temp Temp src Pulse Resp SpO2 Height Weight   03/18/16 1125 (!) 137/95 97.6 F (36.4 C) Tympanic (!) 107 20 98 % 1.626 m 106.6 kg         Review of Systems     Constitutional: No fever      Head: No headache    Eyes: No  redness or pain    ENT: No ear pain  No sore throat    Cardiovascular: no c/o chest pain.  No DOE.  No orthopnea    Respiratory: No cough    No shortness of breath.   No c/o chest wall pain    GI: No vomiting.  No diarrhea. No abd pain    Genitourinary:   no frequency, urgency, pain, burning    Musculoskeletal:   Back and right rib pain    No edema    No symptoms of DVT    Skin: no rashes or skin lesions.    Neurologic:no c/o weakness or paresthesia     No change in speech or vision    All other systems reviewed and negative      Physical Exam     CONSTITUTIONAL:  Vital signs reviewed     Well appearing,  Patient appears comfortable    Alert and oriented X 3    HEAD:   Atraumatic    Normocephalic.    EENT: Normal    NECK:   Normal ROM      Cervical spine nontender          RESPIRATORY / CHEST:  Pain to palp right lower post ribs- no sign of trauma   Breath sounds normal    CARDIOVASCULAR:   RRR     No murmur     No JVD    ABDOMEN: Soft   Nontender    No peritoneal signs     BS +  No masses    UPPER EXTREMITY:   Inspection normal       Normal ROM    LOWER EXTREMITY:   Inspection normal        No edema    No sign of DVT    Normal ROM    NEURO: Normal motor, sensory of all ext     CNs intact   Speech normal    Mentation normal    SPINE:  Vague lumbar pain -even to light touch of skin -no sign of trauma    SKIN:  Warm,  Dry, Normal color, No rashes    LYMPHATIC:         ED Medication Orders     ED Medication Orders     None          Orders Placed During this Encounter     No orders of the defined types were placed in this encounter.      Diagnostic Study Results     Labs     Results     ** No results found for the last 24 hours. **          Radiologic Studies  Radiology Results (24 Hour)     ** No results found for the last 24 hours. **      .  Clinical Course / MDM     Notes:     Consults:       Data Review     Nursing records reviewed and agree: Yes    Pulse Oximetry Analysis - Normal  Laboratory results reviewed by EDP: No    Radiologic study results reviewed by EDP: Yes    Rendering Provider: Beola Cord, MD      Monitors, EKG     Cardiac Monitor (interpreted by ED physician):      EKG (interpreted by ED physician):       Critical Care     Critical care exclusive of time spent performing procedures.    Total time:          Clinical Impression & Disposition     Clinical Impression:  No diagnosis found.    Disposition  ED Disposition     None          Prescriptions  New Prescriptions    No medications on file         Signed,  B. Marchelle Folks, MD  11:49 AM 03/18/2016               Beola Cord, MD  03/18/16 1308

## 2016-03-19 ENCOUNTER — Ambulatory Visit (RURAL_HEALTH_CENTER): Payer: PRIVATE HEALTH INSURANCE | Admitting: Family

## 2016-03-27 ENCOUNTER — Other Ambulatory Visit (RURAL_HEALTH_CENTER): Payer: Self-pay | Admitting: Family

## 2016-03-27 MED ORDER — ALPRAZOLAM 0.25 MG PO TABS
ORAL_TABLET | ORAL | 0 refills | Status: DC
Start: ? — End: 2016-03-27

## 2016-05-01 ENCOUNTER — Other Ambulatory Visit (RURAL_HEALTH_CENTER): Payer: Self-pay | Admitting: Family

## 2016-05-10 ENCOUNTER — Telehealth (RURAL_HEALTH_CENTER): Payer: Self-pay

## 2016-05-10 NOTE — Telephone Encounter (Signed)
I would recommend flonase or coricidin HBP.

## 2016-05-10 NOTE — Telephone Encounter (Signed)
Patient called stating that she woke up this am at 2 with nasal congestion.  She is asking what she can take OTC.

## 2016-05-10 NOTE — Telephone Encounter (Signed)
The patient has been notified of this information and all questions answered.

## 2016-05-20 ENCOUNTER — Emergency Department: Payer: PRIVATE HEALTH INSURANCE

## 2016-05-20 ENCOUNTER — Emergency Department
Admission: EM | Admit: 2016-05-20 | Discharge: 2016-05-20 | Disposition: A | Discharge status: Home / Self Care | Payer: PRIVATE HEALTH INSURANCE | Attending: Emergency Medicine | Admitting: Emergency Medicine

## 2016-05-20 DIAGNOSIS — M545 Low back pain, unspecified: Secondary | ICD-10-CM

## 2016-05-20 MED ORDER — METHOCARBAMOL 500 MG PO TABS
500.0000 mg | ORAL_TABLET | Freq: Four times a day (QID) | ORAL | 0 refills | Status: DC
Start: 2016-05-20 — End: 2016-06-12

## 2016-05-20 MED ORDER — NALOXONE HCL 4 MG/0.1ML NA LIQD
NASAL | 0 refills | Status: AC
Start: 2016-05-20 — End: ?

## 2016-05-20 MED ORDER — HYDROCODONE-ACETAMINOPHEN 5-325 MG PO TABS
1.0000 | ORAL_TABLET | Freq: Four times a day (QID) | ORAL | 0 refills | Status: DC | PRN
Start: 2016-05-20 — End: 2016-08-02

## 2016-05-20 NOTE — ED Provider Notes (Signed)
Physician/Midlevel provider first contact with patient: 05/20/16 1243         History     Chief Complaint   Patient presents with   . Abdominal Pain     Anita Aguilar is a 46 y.o. female presents to the ER with the myriad complaints. Pt reports a fall incident on October 30th. She said she was seen here for the fall and she had a CT scan which she reports was normal. Pt now has a right lower back pain radiating to the right costovertebral angle. The pain is sharp associated with numbness and tingling at right lower back and dull constant on right costovertebral angle. Pt states the pain is worst at night.       Pt notes cold symptoms for the 3 weeks. She said she has completed her course of antibiotic. Pt states chronic loose stool- no change today. Pt states dry cough. Pt is a smoker. Pt denies nausea, vomiting, congestion, or urinary issues. Pt has a history of cholecystectomy.         The history is provided by the patient.   Abdominal Pain   Pain location:  RUQ  Pain quality: dull    Pain radiates to:  Does not radiate  Pain severity:  Mild  Onset quality:  Gradual  Progression:  Unchanged  Relieved by:  Nothing  Associated symptoms: cough    Associated symptoms: no chest pain, no fever, no nausea, no shortness of breath, no sore throat and no vomiting             Past Medical History:   Diagnosis Date   . Anxiety    . Fibromyalgia        Past Surgical History:   Procedure Laterality Date   . CARDIAC CATHETERIZATION  2007   . CHOLECYSTECTOMY     . KNEE SURGERY     . TUBAL LIGATION         History reviewed. No pertinent family history.    Social  Social History   Substance Use Topics   . Smoking status: Current Every Day Smoker     Packs/day: 1.00     Types: Cigarettes   . Smokeless tobacco: Never Used   . Alcohol use No       .     Allergies   Allergen Reactions   . Asa [Aspirin] Anaphylaxis   . Ativan [Lorazepam]    . Codeine    . Klonopin [Clonazepam]    . Morphine    . Penicillins    . Zoloft  [Sertraline]        Home Medications             ALPRAZolam (XANAX) 0.25 MG tablet     TAKE 1 TABLET BY MOUTH 3 TIMES A DAY     chlorzoxazone (PARAFON FORTE DSC) 500 MG tablet     Take 1 tablet (500 mg total) by mouth 4 (four) times daily as needed for Muscle spasms.     fluticasone (FLONASE) 50 MCG/ACT nasal spray     2 sprays by Nasal route daily.Use 2 Sprays into each nostril daily           Review of Systems   Constitutional: Negative for fever.   HENT: Negative for congestion, rhinorrhea, sneezing and sore throat.    Eyes: Negative for discharge and visual disturbance.   Respiratory: Positive for cough. Negative for chest tightness and shortness of breath.    Cardiovascular: Negative for  chest pain.   Gastrointestinal: Positive for abdominal pain. Negative for nausea and vomiting.   Musculoskeletal: Positive for back pain (right lower).   Skin: Negative for color change, pallor, rash and wound.   Neurological: Negative for dizziness, light-headedness, numbness and headaches.   All other systems reviewed and are negative.      Physical Exam    BP: (!) 134/93, Heart Rate: 97, Temp: 99.3 F (37.4 C), Resp Rate: 18, SpO2: 99 %, Weight: 106.6 kg    Physical Exam   Constitutional: She is oriented to person, place, and time. She appears well-developed and well-nourished. No distress.   HENT:   Head: Normocephalic and atraumatic.   Right Ear: External ear normal.   Left Ear: External ear normal.   Nose: Nose normal.   Mouth/Throat: Oropharynx is clear and moist. No oropharyngeal exudate.   Eyes: EOM are normal. Pupils are equal, round, and reactive to light.   Neck: Normal range of motion. Neck supple. No JVD present. No tracheal deviation present. No thyromegaly present.   Cardiovascular: Normal rate, regular rhythm and normal heart sounds.    Pulmonary/Chest: Effort normal and breath sounds normal. No stridor. No respiratory distress. She has no wheezes. She has no rales. She exhibits no tenderness.   Abdominal:  Soft. Bowel sounds are normal. She exhibits no distension and no mass. There is tenderness. There is CVA tenderness. There is no rebound and no guarding. No hernia.   Musculoskeletal: Normal range of motion. She exhibits no edema or deformity.        Lumbar back: She exhibits tenderness.        Back:    Tenderness on right lumbar.   Lymphadenopathy:     She has no cervical adenopathy.   Neurological: She is alert and oriented to person, place, and time.   Skin: Skin is warm and dry. Capillary refill takes less than 2 seconds. No rash noted. She is not diaphoretic. No erythema. No pallor.   Nursing note and vitals reviewed.        Labs     Results     ** No results found for the last 24 hours. **            Radiologic Studies  Radiology Results (24 Hour)     ** No results found for the last 24 hours. **      .      MDM and ED Course     ED Medication Orders     None             MDM    1:11 PM  PMP reviewed. No suspicious activity found. Pt prescribed pain medication.       ED Course              Procedures    Clinical Impression & Disposition     Clinical Impression  Final diagnoses:   Acute right-sided low back pain without sciatica        ED Disposition     ED Disposition Condition Date/Time Comment    Discharge  Mon May 20, 2016  1:09 PM Anita Aguilar discharge to home/self care.    Condition at disposition: Stable           Discharge Medication List as of 05/20/2016  1:09 PM      START taking these medications    Details   HYDROcodone-acetaminophen (NORCO) 5-325 MG per tablet Take 1 tablet by mouth every 6 (six)  hours as needed for Pain., Starting Mon 05/20/2016, Print      methocarbamol (ROBAXIN) 500 MG tablet Take 1 tablet (500 mg total) by mouth 4 (four) times daily., Starting Mon 05/20/2016, Print      naloxone [facility]) 4 MG/0.1ML Liquid nasal spray 1 spray intranasally. If pt does not respond or relapses into respiratory depression call 911. Give additional doses every 2-3 min., Print            The  documentation recorded by my scribe, Andrey Farmer, accurately reflects the services I personally performed and the decisions made by me. Bo Merino, MD             Chucky May, MD  05/20/16 [phone]

## 2016-05-20 NOTE — Discharge Instructions (Signed)
Back Pain (Acute or Chronic)    Back pain is one of the most common problems. The good news is that most people feel better in 1 to 2 weeks, and most of the rest in 1 to 2 months. Most people can remain active.  People experience and describe pain differently; not everyone is the same.   The pain can be sharp, stabbing, shooting, aching, cramping or burning.   Movement, standing, bending, lifting, sitting, or walking may worsen pain.   It can be localized to one spot or area, or it can be more generalized.   It can spread or radiate upwards, to the front, or go down your arms or legs (sciatica).   It can cause muscle spasm.  Most of the time, mechanical problems with the musclesor spine cause the pain. Mechanical problemsare usually caused by an injury to the muscles or ligaments. While illness can cause back pain, it is usually not caused by a serious illness. Mechanical problems include:   Physical activity such as sports, exercise, work, or normal activity   Overexertion, lifting, pushing, pulling incorrectly or too aggressively   Sudden twisting, bending, or stretching from an accident, or accidental movement   Poor posture   Stretching or moving wrong, without noticing pain at the time   Poor coordination, lack of regular exercise (check with your doctor about this)   Spinal disc disease or arthritis   Stress  Pain can also be related to pregnancy, or illness like appendicitis, bladder or kidney infections, pelvic infections, and many other things.  Acute back pain usually gets better in1 to 2 weeks. Back pain related to disk disease, arthritis in the spinal joints or spinal stenosis (narrowing of the spinal canal) can become chronic and last for months or years.  Unless you had a physical injury (for example, a car accident or fall) X-rays are usually not needed for the initial evaluation of back pain. If pain continues and does not respond to medical treatment, X-rays and other tests may be  needed.  Home care  Try these home care recommendations:   When in bed, tryto find a position of comfort. A firm mattress is best. Try lying flat on your back with pillows under your knees. You can also try lying on your side with your knees bent up towards your chest and a pillow between your knees.   At first, do not try to stretch out the sore spots. If there is a strain, it is not like the good soreness you get after exercising without an injury. In this case, stretching may make it worse.   Avoid prolong sitting, long car rides, or travel. This puts more stress on the lower back than standing or walking.   During the first 24 to 72 hours after an acute injury or flare up of chronic back pain, apply an ice pack to the painful area for 20 minutes and then remove it for 20 minutes. Do this over a period of 60 to 90 minutes or several times a day. This will reduce swelling and pain. Wrap the ice pack in a thin towel or plastic to protect your skin.   You can start with ice, then switch to heat. Heat (hot shower, hot bath, or heating pad) reduces pain and works well for muscle spasms. Heat can be applied to the painful area for 20 minutes then remove it for 20 minutes. Do this over a period of 60 to 90 minutes or several times   a day. Do not sleep on a heating pad. It can lead to skin burns or tissue damage.   You can alternate ice and heat therapy. Talk with your doctor aboutthe best treatment for your back pain.   Therapeutic massage can help relax the back muscles without stretching them.   Be aware of safe lifting methods and do not lift anything without stretching first.  Medicines  Talk to your doctor before using medicine, especially if you have other medical problems or are taking other medicines.   You may use over-the-counter medicine as directed on the bottle to control pain, unless another pain medicine was prescribed. If you have chronic conditions like diabetes, liver or kidney disease,  stomach ulcers, or gastrointestinal bleeding, or are taking blood thinners, talk to your doctor before taking any medicine.   Be careful if you are given a prescription medicines, narcotics, or medicine for muscle spasms. They can cause drowsiness, affect your coordination, reflexes, and judgement. Do not drive or operate heavy machinery.  Follow-up care  Follow up with your healthcare provider, or as advised.  A radiologist will review any X-rays that were taken. Your provide will notify you of any new findings that may affect your care.  Call 911  Call emergency services if any of the following occur:   Trouble breathing   Confusion   Very drowsy or trouble awakening   Fainting or loss of consciousness   Rapid or very slow heart rate   Loss of bowel or bladder control  When to seek medical advice  Call your healthcare provider right away if any of these occur:   Pain becomes worse or spreads to your legs   Weakness or numbness in one or both legs   Numbness in the groin or genital area  Date Last Reviewed: 11/18/2014   2000-2016 [employer] [address], [place] [address]. All rights reserved. This information is not intended as a substitute for professional medical care. Always follow your healthcare professional's instructions.

## 2016-05-20 NOTE — ED Triage Notes (Signed)
Patient c/o right flank pain x 2 weeks.  States "It has gotten worse the last 4-5 days."

## 2016-05-21 ENCOUNTER — Encounter (RURAL_HEALTH_CENTER): Payer: Self-pay | Admitting: Family

## 2016-05-21 ENCOUNTER — Ambulatory Visit (RURAL_HEALTH_CENTER): Payer: PRIVATE HEALTH INSURANCE | Admitting: Family

## 2016-05-21 VITALS — BP 150/94 | HR 100 | Temp 98.4°F | Resp 16 | Ht 64.02 in | Wt 239.0 lb

## 2016-05-21 DIAGNOSIS — M5441 Lumbago with sciatica, right side: Secondary | ICD-10-CM

## 2016-05-21 NOTE — Progress Notes (Signed)
Subjective:    Patient ID: Anita Aguilar is a 46 y.o. female.  Presents for [facility] ED follow up of yesterday.      Back Pain   This is a recurrent (First began in October 2017 after a fall. ) problem. The current episode started 1 to 4 weeks ago (2 weeks ago). The problem occurs constantly. The problem is unchanged. The pain is present in the lumbar spine and thoracic spine. The quality of the pain is described as aching, burning, stabbing and shooting. Radiates to: right leg. The pain is at a severity of 8/10. The pain is severe. The pain is the same all the time. The symptoms are aggravated by coughing (all activity). Stiffness is present all day. Associated symptoms include leg pain and tingling. Pertinent negatives include no abdominal pain, bladder incontinence, bowel incontinence, chest pain, fever, numbness, paresis, paresthesias or weakness. Risk factors include lack of exercise, obesity and sedentary lifestyle. She has tried analgesics, heat and home exercises for the symptoms. The treatment provided mild relief.       The following portions of the patient's history were reviewed and updated as appropriate: allergies, current medications, past family history, past medical history, past social history, past surgical history and problem list.    Review of Systems   Constitutional: Negative for fever.   Respiratory: Positive for cough (currently with URI). Negative for chest tightness and shortness of breath.    Cardiovascular: Negative for chest pain, palpitations and leg swelling.   Gastrointestinal: Negative for abdominal pain and bowel incontinence.   Genitourinary: Negative for bladder incontinence.   Musculoskeletal: Positive for back pain and myalgias (of right mid abdominal wall and right flank).   Neurological: Positive for tingling. Negative for weakness, numbness and paresthesias.   Psychiatric/Behavioral: Positive for sleep disturbance (secondary to discomfort).   All other systems reviewed and  are negative.        Objective:      BP (!) 150/94   Pulse 100   Temp 98.4 F (36.9 C) (Temporal Artery)   Resp 16   Ht 1.626 m (5' 4.02")   Wt 108.4 kg (239 lb)   LMP 04/30/2016   BMI 41.00 kg/m     Physical Exam   Constitutional: She is oriented to person, place, and time. She appears well-developed and well-nourished. She appears distressed (mildly).   HENT:   Head: Normocephalic.   Nose: Nose normal.   Mouth/Throat: Oropharynx is clear and moist.   Eyes: Conjunctivae are normal. Pupils are equal, round, and reactive to light. Right eye exhibits no discharge. Left eye exhibits no discharge. No scleral icterus.   Neck: Normal range of motion. Neck supple. No JVD present.   Cardiovascular: Normal rate, regular rhythm, normal heart sounds and intact distal pulses.  Exam reveals no gallop and no friction rub.    No murmur heard.  Pulmonary/Chest: Effort normal and breath sounds normal.   Musculoskeletal: She exhibits tenderness (mid thoraci and lumbar spine laterally to right hip) and deformity (right mid back secondary to spasm). She exhibits no edema.        Thoracic back: She exhibits decreased range of motion, tenderness and spasm.        Lumbar back: She exhibits decreased range of motion, tenderness, bony tenderness and spasm.   Neurological: She is alert and oriented to person, place, and time. She displays normal reflexes. She exhibits normal muscle tone.   Skin: Skin is warm and dry. Capillary refill takes less than  2 seconds. No rash noted. She is not diaphoretic. No erythema. No pallor.   Psychiatric: She has a normal mood and affect. Her behavior is normal. Judgment and thought content normal.   Nursing note and vitals reviewed.        Assessment:       1. Midline low back pain with right-sided sciatica, unspecified chronicity          Plan:       Referral to PT  Continue Robaxin and Norco as previously prescribed  Declines solu-medrol injection  Heat to sore area for 10-15 min, 3-4 times  daily  Follow up if not improving in next 2 weeks; sooner if worse       Procedures

## 2016-05-21 NOTE — Patient Instructions (Signed)
Back Spasm (No Trauma)    Spasm of the back muscles can occur after a sudden forceful twisting or bending force (such as in a car accident), after a simple awkward movement, or after lifting something heavy with poor body positioning. In any case, muscle spasm adds to the pain.Sleeping in an awkward position or on a poor quality mattress can also cause this. Some people respond to emotional stress by tensing the muscles of their back.  Pain that continues may need further evaluation or other types of treatment such as physical therapy.  You don't always need X-rays for the initial evaluation of back pain, unless you had a physical injury such as from a car accident or fall. If your pain continues and doesn't respond to medical treatment, X-rays and other tests may then be done.  Home care   As soon as possible, start sitting or walking again to avoid problems from prolonged bed rest (muscle weakness, worsening back stiffness and pain, blood clots in the legs).   When in bed, try to find a position of comfort. A firm mattress is best. Try lying flat on your back with pillows under your knees. You can also try lying on your side with your knees bent up toward your chest and a pillow between your knees.   Avoid prolonged sitting, long car rides, or travel. This puts more stress on the lower back than standing or walking.   During the first 24 to 72 hours after an injury or flare-up, apply an ice pack to the painful area for 20 minutes, then remove it for 20 minutes. Do this over a period of 60 to 90 minutes or several times a day. This will reduce swelling and pain. Always wrap ice packs in a thin towel.   You can start with ice, then switch to heat. Heat (hot shower, hot bath, or heating pad) reduces pain, and works well for muscle spasms. Apply heat to the painful area for 20 minutes, then remove it for 20 minutes. Do this over a period of 60 to 90 minutes or several times a day. Do not sleep on a heating  pad as it can burn or damage skin.   Alternate ice and heat therapies.   Be aware of safe lifting methods and do not lift anything over 15 pounds untilall the pain is gone.  Gentle stretching will help your back heal faster. Do this simple routine 2 to 3 times a day until your back is feeling better.   Lie on your back with your knees bent and both feet on the ground   Slowly raise your left knee to your chest as you flatten your lower back against the floor. Hold for20 to 30seconds.   Relax and repeat the exercise with your right knee.   Do2 to 3of these exercises for each leg.   Repeat, hugging both knees to your chest at the same time.   Do not bounce, but use a gentle pull.  Medicines  Talk to your doctor before using medicine, especially if you have other medical problems or are taking other medicines.  You may use acetaminophen or ibuprofen to control pain, unless your healthcare provider prescribed another pain medicine. If you have a chronic condition such as diabetes, liver or kidney disease, stomach ulcer, or gastrointestinal bleeding, or are taking blood thinners, talk with your healthcare provider before taking any medicines.  Be careful if you are given prescription pain medicine, narcotics, or medicine for muscle   spasm. They can cause drowsiness, affect your coordination, reflexes, or judgment. Do not drive or operate heavy machinery when taking these medicines. Take pain medicine only as prescribed by your healthcare provider.  Follow-up care  Follow up with your doctor, or as advised. Physical therapy or further tests may be needed.  If X-rays were taken, they may be reviewed by a radiologist. You will be notified of any new findings that may affect your care.  Call 911  Seek emergency medical care if any of these occur:   Trouble breathing   Confusion   Drowsiness or trouble awakening   Fainting or loss of consciousness   Rapid or very slow heart rate   Loss of bowel or bladder  control  When to seek medical advice  Call your healthcare provider right away if any of these occur:   Pain becomes worse or spreads to your legs   Weakness or numbness in one or both legs   Numbness in the groin or genital area   Unexplained fever over 100.4F (38.0C)   Burning or pain when passing urine  Date Last Reviewed: 10/19/2014   2000-2016 [employer] [address], [place] [address]. All rights reserved. This information is not intended as a substitute for professional medical care. Always follow your healthcare professional's instructions.

## 2016-05-22 ENCOUNTER — Telehealth (RURAL_HEALTH_CENTER): Payer: Self-pay

## 2016-05-22 NOTE — Telephone Encounter (Signed)
Patient called stating she got her physical therapy scheduled, but not until Jan 17th.  She is asking if she should stay off work until then.

## 2016-05-24 NOTE — Telephone Encounter (Signed)
I do not think this should preclude her from working.

## 2016-05-24 NOTE — Telephone Encounter (Signed)
The patient has been notified of this information and all questions answered.

## 2016-06-05 ENCOUNTER — Ambulatory Visit: Payer: PRIVATE HEALTH INSURANCE | Attending: Family

## 2016-06-05 DIAGNOSIS — M546 Pain in thoracic spine: Secondary | ICD-10-CM | POA: Insufficient documentation

## 2016-06-05 DIAGNOSIS — M5415 Radiculopathy, thoracolumbar region: Secondary | ICD-10-CM | POA: Insufficient documentation

## 2016-06-05 DIAGNOSIS — R293 Abnormal posture: Secondary | ICD-10-CM | POA: Insufficient documentation

## 2016-06-05 DIAGNOSIS — M6281 Muscle weakness (generalized): Secondary | ICD-10-CM | POA: Insufficient documentation

## 2016-06-07 ENCOUNTER — Other Ambulatory Visit (RURAL_HEALTH_CENTER): Payer: Self-pay | Admitting: Family

## 2016-06-10 MED ORDER — ALPRAZOLAM 0.25 MG PO TABS
ORAL_TABLET | ORAL | 0 refills | Status: DC
Start: ? — End: 2016-06-10

## 2016-06-12 ENCOUNTER — Encounter (RURAL_HEALTH_CENTER): Payer: Self-pay

## 2016-06-12 ENCOUNTER — Ambulatory Visit (RURAL_HEALTH_CENTER): Payer: PRIVATE HEALTH INSURANCE | Admitting: Physician Assistant

## 2016-06-12 VITALS — BP 133/90 | HR 108 | Temp 99.7°F | Resp 20

## 2016-06-12 DIAGNOSIS — J209 Acute bronchitis, unspecified: Secondary | ICD-10-CM

## 2016-06-12 MED ORDER — AZITHROMYCIN 250 MG PO TABS
ORAL_TABLET | ORAL | 0 refills | Status: DC
Start: 2016-06-12 — End: 2016-08-02

## 2016-06-12 MED ORDER — BENZONATATE 100 MG PO CAPS
100.0000 mg | ORAL_CAPSULE | Freq: Three times a day (TID) | ORAL | 1 refills | Status: DC | PRN
Start: 2016-06-12 — End: 2016-08-02

## 2016-06-12 NOTE — Progress Notes (Signed)
[facility]  Extended Hours Clinic     History of Presenting Illness:     Chief Complaint   Patient presents with   . Cough     s/s sunday   . runny nose   . stuffy head       Anita Aguilar is a 46 y.o. female who presents to the office with concern for cough, congestion, rhinorrhea and ?fever x 3 days.  She reports that her symptoms began with congestion 3 days ago and then the rhinorrhea and cough developed.  She denies any nausea, vomiting or diarrhea.  She denies any SOB or wheezing.  She is a current daily smoker but no history of asthma.  She has used inhalers in the past but does not have one at this time.        Past Medical History:     Past Medical History:   Diagnosis Date   . Anxiety    . Fibromyalgia        Past Surgical History:     Past Surgical History:   Procedure Laterality Date   . CARDIAC CATHETERIZATION  2007   . CHOLECYSTECTOMY     . KNEE SURGERY     . TUBAL LIGATION         Family History:   No family history on file.    Social History:     History   Smoking Status   . Current Every Day Smoker   . Packs/day: 1.00   . Types: Cigarettes   Smokeless Tobacco   . Never Used     History   Alcohol Use No     History   Drug Use No       Allergies:     Allergies   Allergen Reactions   . Asa [Aspirin] Anaphylaxis   . Ativan [Lorazepam]    . Codeine    . Klonopin [Clonazepam]    . Morphine    . Penicillins    . Zoloft [Sertraline]        Medications:     Prior to Admission medications    Medication Sig Start Date End Date Taking? Authorizing Provider   ALPRAZolam Prudy Feeler) 0.25 MG tablet TAKE 1 TABLET BY MOUTH 3 TIMES A DAY 06/10/16  Yes Guenthner, Mia [place], NP   fluticasone (FLONASE) 50 MCG/ACT nasal spray 2 sprays by Nasal route daily.Use 2 Sprays into each nostril daily 11/23/15 11/22/16 Yes Corl, Tiffany A, NP   HYDROcodone-acetaminophen (NORCO) 5-325 MG per tablet Take 1 tablet by mouth every 6 (six) hours as needed for Pain. 05/20/16  Yes Chucky May, MD   naloxone [facility]) 4 MG/0.1ML  Liquid nasal spray 1 spray intranasally. If pt does not respond or relapses into respiratory depression call 911. Give additional doses every 2-3 min. 05/20/16   Chucky May, MD   chlorzoxazone (PARAFON FORTE DSC) 500 MG tablet Take 1 tablet (500 mg total) by mouth 4 (four) times daily as needed for Muscle spasms. 02/06/16 06/12/16  Kalman Shan, NP   methocarbamol (ROBAXIN) 500 MG tablet Take 1 tablet (500 mg total) by mouth 4 (four) times daily. 05/20/16 06/12/16  Chucky May, MD       Review of Systems:   ROS  Pertinent positives and negatives as per history of present illness    Physical Exam:     Vitals:    06/12/16 1658   BP: 133/90   Pulse: (!) 108   Resp: 20  Temp: 99.7 F (37.6 C)   SpO2: 97%     There is no height or weight on file to calculate BMI.    Physical Exam   Constitutional: She is oriented to person, place, and time. She appears well-developed and well-nourished. No distress.   HENT:   Head: Normocephalic and atraumatic.   Mouth/Throat: Oropharynx is clear and moist. No oropharyngeal exudate.   Eyes: EOM are normal. Pupils are equal, round, and reactive to light.   Neck: Normal range of motion. Neck supple.   Cardiovascular: Normal rate and regular rhythm.  Exam reveals no gallop and no friction rub.    No murmur heard.  Pulmonary/Chest: Effort normal. No respiratory distress. She has wheezes (scattered expiratory wheezing noted). She has no rales.   Musculoskeletal: Normal range of motion.   Lymphadenopathy:     She has no cervical adenopathy.   Neurological: She is alert and oriented to person, place, and time. She has normal reflexes.   Skin: Skin is warm and dry. Capillary refill takes less than 2 seconds. No rash noted. She is not diaphoretic. No erythema.   Psychiatric: She has a normal mood and affect. Her behavior is normal.   Nursing note and vitals reviewed.      Assessment:     1. Acute bronchitis, unspecified organism         Plan:   1. Discussed the patient that her  symptoms are most consistent with acute bronchitis. Discussed that this is typically viral in etiology  2. I did recommend aggressive prednisone with the patient states that she does not tolerate this medication while still we'll forego this treatment  3. Tessalon Perles for cough  4. Azithromycin to hold his symptoms fail to improve over the next 3-4 days.  5. Follow-up with PCP as needed for persistent or worsening symptoms      Signed by: Neva Seat PA-C

## 2016-06-18 ENCOUNTER — Ambulatory Visit (RURAL_HEALTH_CENTER): Payer: PRIVATE HEALTH INSURANCE | Admitting: Family

## 2016-06-18 ENCOUNTER — Encounter (RURAL_HEALTH_CENTER): Payer: Self-pay | Admitting: Family

## 2016-06-18 VITALS — BP 148/85 | HR 108 | Temp 98.4°F | Resp 18

## 2016-06-18 DIAGNOSIS — J40 Bronchitis, not specified as acute or chronic: Secondary | ICD-10-CM

## 2016-06-18 DIAGNOSIS — J0110 Acute frontal sinusitis, unspecified: Secondary | ICD-10-CM

## 2016-06-18 MED ORDER — CEFDINIR 300 MG PO CAPS
300.0000 mg | ORAL_CAPSULE | Freq: Two times a day (BID) | ORAL | 0 refills | Status: AC
Start: 2016-06-18 — End: 2016-06-23

## 2016-06-18 MED ORDER — PROMETHAZINE-DM 6.25-15 MG/5ML PO SYRP
5.0000 mL | ORAL_SOLUTION | Freq: Four times a day (QID) | ORAL | 0 refills | Status: AC | PRN
Start: 2016-06-18 — End: 2016-06-30

## 2016-06-18 NOTE — Progress Notes (Signed)
[facility]  Extended Hours Clinic     History of Presenting Illness:     Chief Complaint   Patient presents with   . Cough     s/s sunday night   . Shortness of Breath   . chest discomfort when she coughs       Anita Aguilar is a 46 y.o. female who presents to the office with concern for cough, chest congestion c/ shortness of breath when she lays down, and sinus congestion. She denies fever, chills, body aches. This is been going on for a week and a half to 2 weeks and has gotten worse over the last couple of days. She was seen one week ago for the same complaints and was given a Z-pak Rx to hold, which she filled and started today. She declined Prednisone at that time as it has caused panic attacks in the past.    Past Medical History:     Past Medical History:   Diagnosis Date   . Anxiety    . Fibromyalgia        Past Surgical History:     Past Surgical History:   Procedure Laterality Date   . CARDIAC CATHETERIZATION  2007   . CHOLECYSTECTOMY     . KNEE SURGERY     . TUBAL LIGATION         Family History:   No family history on file.    Social History:     History   Smoking Status   . Current Every Day Smoker   . Packs/day: 1.00   . Types: Cigarettes   Smokeless Tobacco   . Never Used     History   Alcohol Use No     History   Drug Use No       Allergies:     Allergies   Allergen Reactions   . Asa [Aspirin] Anaphylaxis   . Ativan [Lorazepam]    . Codeine    . Klonopin [Clonazepam]    . Morphine    . Penicillins    . Zoloft [Sertraline]        Medications:     Prior to Admission medications    Medication Sig Start Date End Date Taking? Authorizing Provider   ALPRAZolam Prudy Feeler) 0.25 MG tablet TAKE 1 TABLET BY MOUTH 3 TIMES A DAY 06/10/16   Kalman Shan, NP   azithromycin (ZITHROMAX Z-PAK) 250 MG tablet Take 2 tabs PO on day and then 1 tab PO on days 2-5 06/12/16   Neva Seat, PA   benzonatate (TESSALON PERLES) 100 MG capsule Take 1 capsule (100 mg total) by mouth 3 (three) times daily as  needed for Cough. 06/12/16 06/12/17  Neva Seat, PA   fluticasone (FLONASE) 50 MCG/ACT nasal spray 2 sprays by Nasal route daily.Use 2 Sprays into each nostril daily 11/23/15 11/22/16  Corl, Tiffany A, NP   HYDROcodone-acetaminophen (NORCO) 5-325 MG per tablet Take 1 tablet by mouth every 6 (six) hours as needed for Pain. 05/20/16   Chucky May, MD   naloxone [facility]) 4 MG/0.1ML Liquid nasal spray 1 spray intranasally. If pt does not respond or relapses into respiratory depression call 911. Give additional doses every 2-3 min. 05/20/16   Chucky May, MD       Review of Systems:   Review of Systems   Constitutional: Negative for chills and fever.   HENT: Positive for congestion, sinus pain and sore throat.  Respiratory: Positive for cough.    Gastrointestinal: Negative for abdominal pain, diarrhea and vomiting.   Musculoskeletal: Negative for myalgias.         Physical Exam:     Vitals:    06/18/16 1651   BP: 148/85   Pulse: (!) 108   Resp: 18   Temp: 98.4 F (36.9 C)   SpO2: 96%     There is no height or weight on file to calculate BMI.    Physical Exam   Constitutional: She is oriented to person, place, and time. She appears well-developed and well-nourished. No distress.   HENT:   Head: Normocephalic and atraumatic.   Right Ear: External ear normal.   Left Ear: External ear normal.   Nose: Right sinus exhibits frontal sinus tenderness. Left sinus exhibits frontal sinus tenderness.   Eyes: Conjunctivae and EOM are normal. Right eye exhibits no discharge. Left eye exhibits no discharge.   Neck: Normal range of motion. Neck supple.   Cardiovascular: Normal rate, regular rhythm and normal heart sounds.    Pulmonary/Chest: Effort normal. She has wheezes in the right upper field and the left upper field.   Lymphadenopathy:     She has no cervical adenopathy.   Neurological: She is alert and oriented to person, place, and time. She exhibits normal muscle tone. Coordination normal.   Skin: Skin is warm and dry.  She is not diaphoretic.   Psychiatric: She has a normal mood and affect. Her behavior is normal. Judgment and thought content normal.   Nursing note and vitals reviewed.      Assessment:     1. Acute non-recurrent frontal sinusitis     2. Bronchitis         Plan:   Albuterol inhaler every 4-6 hours as needed from the [facility] drug closet. If this is not enough to control shortness of breath she needs to go to the emergency room.  We discussed using an inhaled corticosteroid as she does not tolerate systemic, however she is unable to afford the inhaler at this time.  Azithromycin is treatment of choice for lungs, but she may find that it is ineffective for his sinusitis. I printed a wait and see Rx for Omnicef 300 mg twice a day 7 days.  Promethazine DM for cough at night  RTO PRN if no improvement or symptoms worsen.    Signed by: Abram Sander, FNP-BC

## 2016-06-20 ENCOUNTER — Ambulatory Visit: Payer: PRIVATE HEALTH INSURANCE | Attending: Family

## 2016-06-20 DIAGNOSIS — M6281 Muscle weakness (generalized): Secondary | ICD-10-CM | POA: Insufficient documentation

## 2016-06-20 DIAGNOSIS — M5415 Radiculopathy, thoracolumbar region: Secondary | ICD-10-CM | POA: Insufficient documentation

## 2016-06-20 DIAGNOSIS — R293 Abnormal posture: Secondary | ICD-10-CM | POA: Insufficient documentation

## 2016-06-20 DIAGNOSIS — M546 Pain in thoracic spine: Secondary | ICD-10-CM | POA: Insufficient documentation

## 2016-07-16 ENCOUNTER — Other Ambulatory Visit (RURAL_HEALTH_CENTER): Payer: Self-pay | Admitting: Family

## 2016-07-18 ENCOUNTER — Ambulatory Visit: Payer: PRIVATE HEALTH INSURANCE

## 2016-08-02 ENCOUNTER — Ambulatory Visit: Payer: PRIVATE HEALTH INSURANCE | Attending: Family | Admitting: Family

## 2016-08-02 ENCOUNTER — Other Ambulatory Visit
Admission: RE | Admit: 2016-08-02 | Discharge: 2016-08-02 | Disposition: A | Discharge status: Home / Self Care | Payer: PRIVATE HEALTH INSURANCE | Source: Ambulatory Visit | Attending: Family | Admitting: Family

## 2016-08-02 ENCOUNTER — Encounter (RURAL_HEALTH_CENTER): Payer: Self-pay | Admitting: Family

## 2016-08-02 VITALS — BP 156/100 | HR 90 | Temp 98.7°F | Resp 16 | Ht 64.0 in | Wt 240.0 lb

## 2016-08-02 DIAGNOSIS — F17210 Nicotine dependence, cigarettes, uncomplicated: Secondary | ICD-10-CM

## 2016-08-02 DIAGNOSIS — I10 Essential (primary) hypertension: Secondary | ICD-10-CM

## 2016-08-02 LAB — CBC AND DIFFERENTIAL
Bands: 3 % (ref 0–10)
Basophils %: 0 % (ref 0.0–3.0)
Basophils Absolute: 0 10*3/uL (ref 0.0–0.3)
Eosinophils %: 0 % (ref 0.0–7.0)
Eosinophils Absolute: 0 10*3/uL (ref 0.0–0.8)
Hematocrit: 49.3 % — ABNORMAL HIGH (ref 36.0–48.0)
Hemoglobin: 16.1 gm/dL — ABNORMAL HIGH (ref 12.0–16.0)
Lymphocytes Absolute: 2.1 10*3/uL (ref 0.6–5.1)
Lymphocytes: 24 % (ref 15.0–46.0)
MCH: 31 pg (ref 28–35)
MCHC: 33 gm/dL (ref 32–36)
MCV: 93 fL (ref 80–100)
MPV: 9.1 fL (ref 6.0–10.0)
Monocytes Absolute: 0.3 10*3/uL (ref 0.1–1.7)
Monocytes: 4 % (ref 3.0–15.0)
Neutrophils %: 69 % (ref 42.0–78.0)
Neutrophils Absolute: 6.3 10*3/uL (ref 1.7–8.6)
PLT CT: 279 10*3/uL (ref 130–440)
RBC: 5.29 10*6/uL — ABNORMAL HIGH (ref 3.80–5.00)
RDW: 12.5 % (ref 11.0–14.0)
WBC: 8.7 10*3/uL (ref 4.0–11.0)

## 2016-08-02 LAB — COMPREHENSIVE METABOLIC PANEL
ALT: 24 U/L (ref 0–55)
AST (SGOT): 16 U/L (ref 10–42)
Albumin/Globulin Ratio: 1.15 Ratio (ref 0.70–1.50)
Albumin: 3.9 gm/dL (ref 3.5–5.0)
Alkaline Phosphatase: 67 U/L (ref 40–145)
Anion Gap: 16.4 mMol/L (ref 7.0–18.0)
BUN / Creatinine Ratio: 11.5 Ratio (ref 10.0–30.0)
BUN: 7 mg/dL (ref 7–22)
Bilirubin, Total: 0.6 mg/dL (ref 0.1–1.2)
CO2: 21.6 mMol/L (ref 20.0–30.0)
Calcium: 9.4 mg/dL (ref 8.5–10.5)
Chloride: 102 mMol/L (ref 98–110)
Creatinine: 0.61 mg/dL (ref 0.60–1.20)
EGFR: 109 mL/min/{1.73_m2} (ref 60–150)
Globulin: 3.4 gm/dL (ref 2.0–4.0)
Glucose: 121 mg/dL — ABNORMAL HIGH (ref 70–99)
Osmolality Calc: 271 mOsm/kg — ABNORMAL LOW (ref 275–300)
Potassium: 4 mMol/L (ref 3.5–5.3)
Protein, Total: 7.3 gm/dL (ref 6.0–8.3)
Sodium: 136 mMol/L (ref 136–147)

## 2016-08-02 LAB — THYROID STIMULATING HORMONE (TSH), REFLEX ON ABNORMAL TO FREE T4, SERUM: TSH: 1.38 u[IU]/mL (ref 0.40–4.20)

## 2016-08-02 LAB — LIPID PANEL
Cholesterol: 209 mg/dL — ABNORMAL HIGH (ref 75–199)
Coronary Heart Disease Risk: 5.36
HDL: 39 mg/dL — ABNORMAL LOW (ref 45–65)
LDL Calculated: 135 mg/dL
Triglycerides: 174 mg/dL — ABNORMAL HIGH (ref 10–150)
VLDL: 35 (ref 0–40)

## 2016-08-02 MED ORDER — LISINOPRIL 10 MG PO TABS
10.0000 mg | ORAL_TABLET | Freq: Every day | ORAL | 2 refills | Status: DC
Start: ? — End: 2016-08-02

## 2016-08-02 NOTE — Patient Instructions (Signed)
Discharge Instructions for High Blood Pressure (Hypertension)  You have been diagnosed with high blood pressure (also called hypertension). This means the force of blood against your artery walls is too strong. It also means your heart is working hard to move blood. High blood pressure usually has no symptoms, but over time, it can damage your heart, blood vessels, eyes, kidneys, and other organs. With help from your doctor, you can manage your blood pressure and protect your health.  Taking medicine   Learn to take your own blood pressure. Keep a record of your results. Ask your doctor which readings mean that you need medical attention.   Take your blood pressure medicine exactly as directed. Don't skip doses. Missing dosescan cause your blood pressure to get out of control.   If you do miss a dose (or doses) check with your healthcare provider about what to do.   Don't take medicines that contain heart stimulants, including over-the-counter medicines. Check for warnings about high blood pressure on the label. Ask the pharmacist before purchasing something you haven't used before   Check with your doctor or pharmacist before taking a decongestant. Some decongestants can worsen high blood pressure.  Lifestyle changes   Maintain a healthy weight. Get help to lose any extra pounds.   Cut back on salt.   Limit canned, dried, packaged, and fast foods.   Don't add salt to your food at the table.   Season foods with herbs instead of salt when you cook.   Request no added salt when you go to a restaurant.   The American Heart Association (AHA) says the "ideal" amount of sodiumis no more than1,500 mg aday. But becauseAmericans eat so much salt, you can make a positive change bycutting back to even 2,400 mg of sodium a day.   Follow the DASH (Dietary Approaches to Stop Hypertension) eating plan. This plan recommends vegetables, fruits, whole gains, and other heart healthy foods.   Begin an exercise  program. Ask your doctor how to get started. The AHA recommends aerobic exercise 3 to 4 times a week for an average of 40 minutes at a time, with your doctor's approval. Simple activities like walking or gardening can help.   Break the smoking habit. Enroll in a stop-smoking program to improve your chances of success. Ask your healthcare provider about programs and medicines to help you stop smoking.   Limit drinks that contain caffeine such as coffee, black or green tea, and cola to 2 per day.   Never take stimulants such as amphetamines or cocaine. These drugs can be deadly for someone with high blood pressure.   Control your stress. Learn stress-management techniques.   Limit alcohol to no more than 1 drink a day for women and 2 drinks a day for men.  Follow-up care  Make a follow-up appointment as directed.  When to call your healthcare provider  Call your healthcare provider right away if you have any of the following:   Chest pain or shortness of breath (call 911)   Moderate to severe headache   Weakness in the muscles of your face, arms, or legs   Trouble speaking   Extreme drowsiness   Confusion   Fainting or dizziness   Pulsating or rushing sound in your ears   Unexplainednosebleed   Weakness, tingling, or numbness of your face, arms, or legs   Change in vision   Blood pressure measured at home that is greater than 180/110   Date Last Reviewed:   09/14/2014   2000-2017 [employer] [address], [place] [address]. All rights reserved. This information is not intended as a substitute for professional medical care. Always follow your healthcare professional's instructions.        Discharge Instructions: Eating a Low-Salt Diet  Your healthcare provider has prescribed a low-salt diet for you. Most people with heart problems need to eat less salt, which is full of sodium. Too much sodium is linked to high blood pressure, whichis linked to a greater risk of heart disease,  stroke, blindness, and kidney problems.  Home care   Learn ways to cut back on salt (sodium):   Eat less frozen, canned, dried, packaged, and fast foods. These often contain high amounts of sodium.   Season foods with herbs instead of salt when you cook.   Season with flavorings such as pepper, lemon, garlic, and onion.   Don't add salt to your food at the table.   Sprinkle salt-free herbal blends on meats and vegetables.  Learn to read food labels carefully:   Look for the total amount of sodium per serving.   Look for foods labeled low sodium, reduced sodium, or no added salt.   Beware. Salt goes by many names. Cut down on foods with these words (all forms of salt) listed as ingredients:   Salt   Sodium   Soy sauce   Baking soda   Baking powder   MSG (monosodium glutamate)   Monosodium   Na (the chemical symbol for sodium)  Other ideas:   Use more fresh food. Buy more fruits and vegetables.   Select lean meats, fish, and poultry.   Find a cookbook with low-salt recipes. You'll find ideas for tasty meals that are healthy for your heart.   When eating out, ask questions about the menu. Tell the waiter you're on a low-salt diet.   If you order fish, chicken, beef, or pork, ask to have it broiled, baked, poached, or grilled without salt, butter, or breading.   Choose plain steamed rice, boiled noodles, and baked or boiled potatoes. Top potatoes with chives and a little sour cream instead of butter.   Avoid antacids that are high in salt. Check the label before you buy.  Follow-up  Make a follow-up appointment with your healthcare provider, or as advised. Your provider may refer you to a dietitian.  Date Last Reviewed: 10/19/2015   2000-2017 [employer]. [address], [place] [address]. All rights reserved. This information is not intended as a substitute for professional medical care. Always follow your healthcare professional's instructions.

## 2016-08-02 NOTE — Progress Notes (Signed)
Subjective:    Patient ID: Anita Aguilar is a 46 y.o. female.    HPI  Presents for concerns of hypertension.  She recently was seen by her eye doctor and told her exam shows some retinal changes related to hypertension.  She is very emotional during the exam today.  Asking pertinent questions about HTN sequela.  She denies CP, SOB, palpitations and peripheral edema.  She denies feeling light headed.  Concerned that her fibromyalgia pain has caused her HTN.  I have spent several minutes in education and reassurance.        The following portions of the patient's history were reviewed and updated as appropriate: allergies, current medications, past family history, past medical history, past social history, past surgical history and problem list.    Review of Systems   Constitutional: Positive for fatigue. Negative for diaphoresis.   Respiratory: Negative for cough, chest tightness and shortness of breath.    Cardiovascular: Negative for chest pain, palpitations and leg swelling.   Musculoskeletal: Positive for arthralgias and myalgias.   Neurological: Positive for headaches (intermittent, mild). Negative for dizziness, tremors, weakness and light-headedness.   Psychiatric/Behavioral: Positive for dysphoric mood. The patient is nervous/anxious.    All other systems reviewed and are negative.        Objective:      BP (!) 156/100   Pulse 90   Temp 98.7 F (37.1 C) (Temporal Artery)   Resp 16   Ht 1.626 m (5\' 4" )   Wt 108.9 kg (240 lb)   BMI 41.20 kg/m     Physical Exam   Constitutional: She is oriented to person, place, and time. She appears well-developed and well-nourished. She appears distressed (emotionally).   HENT:   Head: Normocephalic and atraumatic.   Nose: Nose normal.   Mouth/Throat: Oropharynx is clear and moist.   Eyes: Conjunctivae are normal. Pupils are equal, round, and reactive to light. Right eye exhibits no discharge. Left eye exhibits no discharge. No scleral icterus.   Neck: Normal range  of motion. Neck supple. No JVD present.   Cardiovascular: Normal rate, regular rhythm, normal heart sounds and intact distal pulses.    Pulmonary/Chest: Effort normal and breath sounds normal.   Neurological: She is alert and oriented to person, place, and time.   Skin: Skin is warm and dry. Capillary refill takes less than 2 seconds. She is not diaphoretic.   Psychiatric: Her behavior is normal. Judgment and thought content normal. Her mood appears anxious.   tearful   Nursing note and vitals reviewed.        Assessment:       1. Essential hypertension          Plan:       CBC, CMP, Lipid panel, TSH w/reflex T4  Will call with lab results and recommendations as indicated.   Lisinopril 10 mg po bid  Limit sodium in diet  Walk at least 20-30 min 4-5 times daily  Follow up in 2 weeks; sooner prn     Procedures

## 2016-08-02 NOTE — Progress Notes (Signed)
Date Specimen Drawn:  08/02/2016   Time Specimen Drawn:  11:13 AM   Test(s) Ordered:  Tsh,cbc,cmp,lipid   Disposition:  n/a   Patient's Tolerance:  Good   Location Specimen Drawn:  Right antecubital

## 2016-08-06 ENCOUNTER — Telehealth (RURAL_HEALTH_CENTER): Payer: Self-pay | Admitting: Family

## 2016-08-06 NOTE — Telephone Encounter (Signed)
-----   Message from Lenard Lance, NP sent at 08/06/2016  7:58 AM EDT -----  Please let Ms Lorman know her lab work appears WNL.  He blood sugar and triglycerides are elevated; I would like to check her A1C at her follow up visit.

## 2016-08-06 NOTE — Telephone Encounter (Signed)
Pt aware.

## 2016-08-16 ENCOUNTER — Encounter (RURAL_HEALTH_CENTER): Payer: Self-pay | Admitting: Family

## 2016-08-16 ENCOUNTER — Ambulatory Visit: Payer: PRIVATE HEALTH INSURANCE | Attending: Family | Admitting: Family

## 2016-08-16 ENCOUNTER — Other Ambulatory Visit (RURAL_HEALTH_CENTER): Payer: Self-pay | Admitting: Family

## 2016-08-16 ENCOUNTER — Other Ambulatory Visit
Admission: RE | Admit: 2016-08-16 | Discharge: 2016-08-16 | Disposition: A | Discharge status: Home / Self Care | Payer: PRIVATE HEALTH INSURANCE | Source: Ambulatory Visit | Attending: Family | Admitting: Family

## 2016-08-16 VITALS — BP 136/88 | HR 92 | Temp 98.4°F | Resp 16 | Ht 64.02 in | Wt 239.0 lb

## 2016-08-16 DIAGNOSIS — G8929 Other chronic pain: Secondary | ICD-10-CM

## 2016-08-16 DIAGNOSIS — R739 Hyperglycemia, unspecified: Secondary | ICD-10-CM

## 2016-08-16 DIAGNOSIS — I10 Essential (primary) hypertension: Secondary | ICD-10-CM

## 2016-08-16 DIAGNOSIS — M545 Low back pain: Secondary | ICD-10-CM

## 2016-08-16 LAB — HEMOGLOBIN A1C: Hgb A1C, %: 6.3 %

## 2016-08-16 NOTE — Patient Instructions (Signed)
Back Care Tips    Caring for your back  These are things you can do to prevent a recurrence of acute back pain and to reduce symptoms from chronic back pain:   Maintain a healthy weight. If you are overweight, losing weight will help most types of back pain.   Exercise is an important part of recovery from most types of back pain. The muscles behind and in front of the spine support the back. This means strengthening both the back muscles and the abdominal muscles will provide better support for your spine.   Swimming and brisk walking are good overall exercises to improve your fitness level.   Practice safe lifting methods (below).   Practice good posture when sitting, standing and walking. Avoid prolonged sitting. This puts more stress on the lower back than standing or walking.   Wear quality shoes with sufficient arch support. Foot and ankle alignment can affect back symptoms. Women should avoid wearing high heels.   Therapeutic massage can help relax the back muscles without stretching them.   During the first 24 to 72 hours after an acute injury or flare-up of chronic back pain, apply an ice pack to the painful area for 20 minutes and then remove it for 20 minutes, over a period of 60 to 90 minutes, or several times a day. As a safety precaution, do not use a heating pad at bedtime. Sleeping on a heating pad can lead to skin burns or tissue damage.   You can alternate ice and heat therapies.  Medications  Talk to your healthcare provider before using medicines, especially if you have other medical problems or are taking other medicines.   You may use acetaminophen or ibuprofen to control pain, unless your healthcare provider prescribed other pain medicine. If you have chronic conditions like diabetes, liver or kidney disease, stomach ulcers, or gastrointestinal bleeding, or are taking blood thinners, talk with your healthcare provider before taking any medicines.   Be careful if you are given  prescription pain medicines, narcotics, or medicine for muscle spasm. They can cause drowsiness, affect your coordination, reflexes, and judgment. Do not drive or operate heavy machinery while taking these types of medicines. Take prescription pain medicine only as prescribed by your healthcare provider.  Lumbar stretch  Here is a simple stretching exercise that will help relax muscle spasm and keep your back more limber. If exercise makes your back pain worse, don't do it.   Lie on your back with your knees bent and both feet on the ground.   Slowly raise your left knee to your chest as you flatten your lower back against the floor. Hold for 5 seconds.   Relax and repeat the exercise with your right knee.   Do 10 of these exercises for each leg.  Safe lifting method   Don't bend over at the waist to lift an object off the floor. Instead, bend your knees and hips in a squat.   Keep your back and head upright   Hold the object close to your body, directly in front of you.   Straighten your legs to lift the object.   Lower the object to the floor in the reverse fashion.   If you must slide something across the floor, push it.  Posture tips  Sitting  Sit in chairs with straight backs or low-back support. Keep your knees lower than your hips, with your feet flat on the floor.  When driving, sit up straight. Adjust the   seat forward so you are not leaning toward the steering wheel. A small pillow or rolled towel behind your lower back may help if you are driving long distances.  Standing  When standing for long periods, shift most of your weight to one leg at a time. Alternate legs every few minutes.  Sleeping  The best way to sleep is on your side with your knees bent. Put a low pillow under your head to support your neck in a neutral spine position. Avoid thick pillows that bend your neck to one side. Put a pillow between your legs to further relax your lower back. If you sleep on your back, put pillows  under your knees to support your legs in a slightly flexed position. Use a firm mattress. If your mattress sags, replace it, or use a 1/2-inch plywood board under the mattress to add support.  Follow-up care  Follow up with yourhealthcare provider, or as advised.  If X-rays, a CT scan or an MRI scan were taken, they will be reviewed by a radiologist. You will be notified of any new findings that may affect your care.  Call 911  Seek emergency medical care if any of the following occur:   Trouble breathing   Confusion   Very drowsy   Fainting or loss of consciousness   Rapid or very slow heart rate   Loss of bowel or bladder control  When to seek medical care  Call your healthcare provider if any of the following occur:   Pain becomes worse or spreads to your arms or legs   Weakness or numbness in one or both arms or legs   Numbness in the groin area  Date Last Reviewed: 10/19/2014   2000-2016 [employer] [address], [place] [address]. All rights reserved. This information is not intended as a substitute for professional medical care. Always follow your healthcare professional's instructions.

## 2016-08-16 NOTE — Progress Notes (Signed)
Subjective:    Patient ID: Anita Aguilar is a 46 y.o. female.    HPI  Anita Aguilar presents today for follow up of HTN.  Anita Aguilar has been taking lisinopril 10 mg po for the last 2 weeks.  Her blood pressure has come down from 156/100.  Anita Aguilar denies feeling lightheaded or fatigued.  Anita Aguilar states Anita Aguilar has also been trying to walk and eat better.  Anita Aguilar is happy that Anita Aguilar has lost 1.5 lbs.  I have encouraged her to continue with her lifestyle changes.      Anita Aguilar does state Anita Aguilar has had a recurrence of low back discomfort.  The pain is located in her right mid lumbar region, radiating up into her thoracic area and down into her right leg.  It is worse with movement, mostly bending.  Anita Aguilar is unable to take NSAID's secondary to an aspirin allergy.  Anita Aguilar did start taking acetaminophen yesterday.      Anita Aguilar was hyperglycemic with her last lab draw.  Will check an A1C today.  Does state Anita Aguilar urinates frequently.      The following portions of the patient's history were reviewed and updated as appropriate: allergies, current medications, past family history, past medical history, past social history, past surgical history and problem list.    Review of Systems   Constitutional: Negative for chills, diaphoresis, fatigue and fever.   Respiratory: Negative for cough, chest tightness and shortness of breath.    Cardiovascular: Negative for chest pain, palpitations and leg swelling.   Gastrointestinal: Negative for abdominal pain, constipation and nausea.   Genitourinary: Positive for frequency. Negative for decreased urine volume, difficulty urinating, dysuria and urgency.   Musculoskeletal: Positive for back pain and myalgias (chronic; fibromyalgia). Negative for neck pain.   Neurological: Negative for dizziness, tremors and light-headedness.   All other systems reviewed and are negative.        Objective:      BP 136/88   Pulse 92   Temp 98.4 F (36.9 C) (Temporal Artery)   Resp 16   Ht 1.626 m (5' 4.02")   Wt 108.4 kg (239 lb)   BMI 41.00  kg/m     Physical Exam   Constitutional: Anita Aguilar is oriented to person, place, and time. Anita Aguilar appears well-developed and well-nourished. Anita Aguilar appears distressed (mildly).   HENT:   Head: Normocephalic and atraumatic.   Nose: Nose normal.   Mouth/Throat: Oropharynx is clear and moist.   Eyes: Conjunctivae are normal. Pupils are equal, round, and reactive to light. Right eye exhibits no discharge. Left eye exhibits no discharge. No scleral icterus.   Neck: Normal range of motion. Neck supple. No JVD present.   Cardiovascular: Normal rate, regular rhythm, normal heart sounds and intact distal pulses.    Pulmonary/Chest: Effort normal and breath sounds normal.   Musculoskeletal:        Lumbar back: Anita Aguilar exhibits decreased range of motion and tenderness. Anita Aguilar exhibits no deformity and no spasm.   Neurological: Anita Aguilar is alert and oriented to person, place, and time.   Skin: Skin is warm and dry. Capillary refill takes less than 2 seconds. Anita Aguilar is not diaphoretic.   Psychiatric: Anita Aguilar has a normal mood and affect. Her behavior is normal. Judgment and thought content normal.   Nursing note and vitals reviewed.        Assessment:       1. Essential hypertension    2. Chronic low back pain, unspecified back pain laterality, with sciatica presence unspecified  3. Hyperglycemia          Plan:       Referral to physical therapy  May apply medium heat to area of discomfort 10-15 min, 3-4 times daily  Acetaminophen prn discomfort; follow package directions  Work excuse for today  Recommend no strenuous activity or lifting greater than 10 lbs    A1C  Will call with lab results and recommendations as indicated.     Follow up in 6 months; sooner prn           Procedures

## 2016-08-16 NOTE — Progress Notes (Signed)
Date Specimen Drawn:  08/16/2016   Time Specimen Drawn:  11:37 AM   Test(s) Ordered:  hgb a1c   Disposition:  n/a   Patient's Tolerance:  Good   Location Specimen Drawn:  Right hand

## 2016-08-19 ENCOUNTER — Emergency Department: Payer: PRIVATE HEALTH INSURANCE

## 2016-08-19 ENCOUNTER — Emergency Department
Admission: EM | Admit: 2016-08-19 | Discharge: 2016-08-19 | Disposition: A | Discharge status: Home / Self Care | Payer: PRIVATE HEALTH INSURANCE | Attending: Emergency Medicine | Admitting: Emergency Medicine

## 2016-08-19 DIAGNOSIS — M5441 Lumbago with sciatica, right side: Secondary | ICD-10-CM | POA: Insufficient documentation

## 2016-08-19 DIAGNOSIS — G8929 Other chronic pain: Secondary | ICD-10-CM | POA: Insufficient documentation

## 2016-08-19 HISTORY — DX: Essential (primary) hypertension: I10

## 2016-08-19 MED ORDER — NALOXONE HCL 4 MG/0.1ML NA LIQD
NASAL | 0 refills | Status: DC
Start: 2016-08-19 — End: 2016-10-17

## 2016-08-19 MED ORDER — HYDROCODONE-ACETAMINOPHEN 5-325 MG PO TABS
1.0000 | ORAL_TABLET | Freq: Four times a day (QID) | ORAL | 0 refills | Status: AC | PRN
Start: 2016-08-19 — End: ?

## 2016-08-19 NOTE — ED Triage Notes (Signed)
Pt fell in Oct.  (Has DJD, fibromyal, etc- chronic back problems- see history)  Went to referral and they "taped my back" in Feb, which pt states seemed to help for a while.  Seen Fri at PCP  and was given another referral, but unable to get into see that doctor due to insurance issues.  Having urinary urgency since Fri .

## 2016-08-19 NOTE — ED Provider Notes (Signed)
[facility]   EMERGENCY DEPARTMENT HISTORY AND PHYSICAL EXAM    Date: 08/19/2016  Patient Name: Anita Aguilar, Anita Aguilar  Attending Physician:E Selinda Orion, MD  Patient DOB:  1971/02/12  MRN:  [id number]  Room:  ED5/ED5-A    Patient was evaluated by ED physician, Terrill Mohr, MD at 12:26 PM    History     Chief Complaint   Patient presents with   . Back Pain       The patient Anita Aguilar, is a 46 y.o. female who presents with chief complaint of Low back pain. The pain radiates around to the right groin with certain motions. She also has some sciatica on the right side. This is not unusual for her. She has a history of chronic low back pain and had a fall in October with worsening of the back pain. She has had 2 prior visits this emergency department and had physical therapy in February with improvement. The local pain in her low back has been worse in the last number of days. She had a visit to Dr. Marigene Ehlers office 3 days ago. She had a hemoglobin A1c drawn at that time. It came back at 6.3. No new or different symptoms compared to her prior chronic low back pain. No new bowel or bladder complaints. She does have stress incontinence. She has urinary urgency but no dysuria.    PCP:  Kalman Shan, NP      Past Medical History       Past Medical History:   Diagnosis Date   . Anxiety    . Fibromyalgia    . Hypertension          Past Surgical History       Past Surgical History:   Procedure Laterality Date   . CARDIAC CATHETERIZATION  2007   . CHOLECYSTECTOMY     . KNEE SURGERY     . TUBAL LIGATION           Family History    History reviewed. No pertinent family history.    Social History    Social History     Social History   . Marital status: Married     Spouse name: N/A   . Number of children: N/A   . Years of education: N/A     Social History Main Topics   . Smoking status: Current Every Day Smoker     Packs/day: 1.00     Types: Cigarettes   . Smokeless tobacco: Never Used   . Alcohol use No   . Drug use: No    . Sexual activity: Not Asked     Other Topics Concern   . None     Social History Narrative   . None       Allergies    Allergies   Allergen Reactions   . Asa [Aspirin] Anaphylaxis   . Ativan [Lorazepam]    . Codeine    . Klonopin [Clonazepam]    . Morphine    . Penicillins    . Zoloft [Sertraline]          Current/Home Medications    Current/Home Medications    ALPRAZOLAM (XANAX) 0.25 MG TABLET    TAKE 1 TABLET BY MOUTH 3 TIMES A DAY    LISINOPRIL (PRINIVIL,ZESTRIL) 10 MG TABLET    Take 1 tablet (10 mg total) by mouth daily.    NALOXONE (NARCAN) 4 MG/0.1ML LIQUID NASAL SPRAY    1 spray intranasally. If pt  does not respond or relapses into respiratory depression call 911. Give additional doses every 2-3 min.       Vital Signs     BP 127/88   Pulse 94   Temp 98.3 F (36.8 C)   Resp 16   Ht 1.626 m   Wt 108 kg   LMP 08/19/2016   SpO2 98%   BMI 40.85 kg/m   Patient Vitals for the past 24 hrs:   BP Temp Pulse Resp SpO2 Height Weight   08/19/16 1145 127/88 98.3 F (36.8 C) 94 16 98 % 1.626 m 108 kg         Review of Systems     GENERAL: no fevers or chills or malaise    Head: no headache    Cardiovascular: No chest pain    GU:  No dysuria. No flank pain    GI: No nausea or vomiting, no abdominal pain    Neurological:  No headache. No acute focal motor or sensory complaints     Musculoskeletal:  No pain. No edema. No cyanosis. No symptoms of DVT    Skin:  No rash.  No skin lesions.    All other systems reviewed and negative except as above, pertinent findings in HPI.     Physical Exam     CONSTITUTIONAL  Patient is afebrile, Vital signs reviewed, Well appearing,  Patient appears comfortable, Alert and oriented X 3  HEAD   Atraumatic, Normocephalic.  EYES   Pupils equal, round and reactive to light, Extraocular muscles intact.   NECK   FROM, Supple  RESPIRATORY CHEST   Chest is nontender, Breath sounds normal, No respiratory distress.  CARDIOVASCULAR   RRR, No murmur, gallop, rub or JVD  ABDOMEN   Abdomen is  nontender. No mass in the right groin. No significant tenderness  MUSCULOSKELETAL: Mild to moderate tenderness upper lumbar spine with more tenderness in the right paraspinous area and the region of the right SIJ  UPPER EXTREMITY   Inspection normal, No cyanosis. No edema. ROM normal; No evidence of VTE  LOWER EXTREMITY   Inspection normal, No cyanosis. No edema. ROM normal. No evidence of VTE  NEURO Normal motor, Normal sensory, CNs intact, Speech normal. Vision normal. Mentation normal. Normal strength, sensation, and reflexes in the lower extremities bilaterally  SKIN   Skin is warm, Skin is dry, Skin is normal color. No rashes  PSYCHIATRIC   Normal affect,  Normal insight, Normal concentration.      ED Medication Orders     ED Medication Orders     None          Orders Placed During this Encounter     No orders of the defined types were placed in this encounter.      Diagnostic Study Results     Labs     Results     ** No results found for the last 24 hours. **          Radiologic Studies  Radiology Results (24 Hour)     ** No results found for the last 24 hours. **      .    Clinical Course        MDM       Notes:     Consults:       Data Review     Nursing records reviewed and agree: Yes    Pulse Oximetry Analysis - Normal  Laboratory results reviewed by EDP: No  Radiologic study results reviewed by EDP: No      Rendering Provider: Charissa Bash, MD      Monitors, EKG     Cardiac Monitor (interpreted by ED physician):      EKG (interpreted by ED physician):       Critical Care     Critical care exclusive of time spent performing procedures.    Total time:          Clinical Impression & Disposition     Clinical Impression:  1. Chronic right-sided low back pain with right-sided sciatica        Disposition  ED Disposition     ED Disposition Condition Date/Time Comment    Discharge  Mon Aug 19, 2016 12:32 PM Kathreen Cosier Depp discharge to home/self care.    Condition at disposition: Stable           Prescriptions    New Prescriptions    HYDROCODONE-ACETAMINOPHEN (NORCO) 5-325 MG PER TABLET    Take 1-2 tablets by mouth every 6 (six) hours as needed for Pain.for up to 15 doses    NALOXONE (NARCAN) 4 MG/0.1ML NASAL SPRAY    1 spray intranasally. If pt does not respond or relapses into respiratory depression call 911. Give additional doses every 2-3 min.    NALOXONE (NARCAN) 4 MG/0.1ML NASAL SPRAY    1 spray intranasally. If pt does not respond or relapses into respiratory depression call 911. Give additional doses every 2-3 min.    NALOXONE (NARCAN) 4 MG/0.1ML NASAL SPRAY    1 spray intranasally. If pt does not respond or relapses into respiratory depression call 911. Give additional doses every 2-3 min.    NALOXONE (NARCAN) 4 MG/0.1ML NASAL SPRAY    1 spray intranasally. If pt does not respond or relapses into respiratory depression call 911. Give additional doses every 2-3 min.    NALOXONE (NARCAN) 4 MG/0.1ML NASAL SPRAY    1 spray intranasally. If pt does not respond or relapses into respiratory depression call 911. Give additional doses every 2-3 min.    NALOXONE (NARCAN) 4 MG/0.1ML NASAL SPRAY    1 spray intranasally. If pt does not respond or relapses into respiratory depression call 911. Give additional doses every 2-3 min.         Maia Breslow, MD  08/19/2016 12:34 PM              Charissa Bash, MD  08/19/16 [phone]

## 2016-08-19 NOTE — Discharge Instructions (Signed)
Possible Causes of Low Back or Leg Pain    The symptoms in your back or leg may be due to pressure on a nerve. This pressure may be caused by a damaged disk or by abnormal bone growth. Either way, you may feel pain, burning, tingling, or numbness. If you have pressure on a nerve that connects to the sciatic nerve, pain may shoot down your leg.    Pressure from the disk  Constant wear and tear can weaken a disk over time and cause back pain. The disk can then be damaged by a sudden movement or injury. If its soft center begins to bulge, the disk may press on a nerve. Or the outside of the disk may tear, and the soft center may squeeze through and pinch a nerve.    Pressure from bone  As a disk wears out, the vertebrae right above and below the disk begin to touch. This can put pressure on a nerve. Often, abnormal bone (called bone spurs) grows where the vertebrae rub against each other. This can cause the foramen or the spinal canal to narrow (called stenosis) and press against a nerve.  Date Last Reviewed: 02/20/2014   2000-2017 [employer] [address], [place] [address]. All rights reserved. This information is not intended as a substitute for professional medical care. Always follow your healthcare professional's instructions.

## 2016-08-20 ENCOUNTER — Telehealth (RURAL_HEALTH_CENTER): Payer: Self-pay | Admitting: Family

## 2016-08-20 ENCOUNTER — Other Ambulatory Visit (RURAL_HEALTH_CENTER): Payer: Self-pay | Admitting: Family

## 2016-08-20 MED ORDER — METFORMIN HCL 500 MG PO TABS
500.0000 mg | ORAL_TABLET | Freq: Every morning | ORAL | 2 refills | Status: DC
Start: ? — End: 2016-08-20

## 2016-08-20 NOTE — Telephone Encounter (Signed)
Pt aware.

## 2016-08-20 NOTE — Telephone Encounter (Signed)
-----   Message from Lenard Lance, NP sent at 08/20/2016  7:57 AM EDT -----  Anita Aguilar's A1C is elevated.  She is pre-diabetic.  I would like to start her on metformin 500 mg with breakfast and we will recheck her labs in 3 months.  She needs to cut out refined sugars and decrease her carbohydrate intake.  Exercise 3-4 time weekly.

## 2016-08-20 NOTE — Telephone Encounter (Signed)
Pt needs metformin called into [facility]

## 2016-09-09 ENCOUNTER — Ambulatory Visit: Payer: PRIVATE HEALTH INSURANCE

## 2016-09-09 ENCOUNTER — Encounter (RURAL_HEALTH_CENTER): Payer: Self-pay

## 2016-09-09 VITALS — Wt 233.0 lb

## 2016-09-09 DIAGNOSIS — R634 Abnormal weight loss: Secondary | ICD-10-CM

## 2016-09-09 NOTE — Progress Notes (Signed)
Pt came in to be weighed Weight 233

## 2016-09-16 ENCOUNTER — Telehealth (RURAL_HEALTH_CENTER): Payer: Self-pay

## 2016-09-16 ENCOUNTER — Other Ambulatory Visit (RURAL_HEALTH_CENTER): Payer: Self-pay | Admitting: Family

## 2016-09-16 NOTE — Telephone Encounter (Signed)
Received fax from [facility] for a refill on Fluticasone spray.  Not on current med list, on historical.

## 2016-09-18 MED ORDER — METFORMIN HCL 500 MG PO TABS
500.0000 mg | ORAL_TABLET | Freq: Every morning | ORAL | 1 refills | Status: DC
Start: ? — End: 2016-09-18

## 2016-09-18 MED ORDER — LISINOPRIL 10 MG PO TABS
10.0000 mg | ORAL_TABLET | Freq: Every day | ORAL | 1 refills | Status: DC
Start: ? — End: 2016-09-18

## 2016-09-25 ENCOUNTER — Other Ambulatory Visit (RURAL_HEALTH_CENTER): Payer: Self-pay | Admitting: Family

## 2016-09-25 ENCOUNTER — Telehealth (RURAL_HEALTH_CENTER): Payer: Self-pay | Admitting: Family

## 2016-09-25 MED ORDER — FLUTICASONE PROPIONATE 50 MCG/ACT NA SUSP
1.0000 | Freq: Every day | NASAL | 2 refills | Status: DC
Start: ? — End: 2016-09-25

## 2016-09-25 NOTE — Telephone Encounter (Signed)
Pt was on Fluticasone year ago would like refill sent to [facility]

## 2016-10-02 ENCOUNTER — Other Ambulatory Visit (RURAL_HEALTH_CENTER): Payer: Self-pay | Admitting: Family

## 2016-10-17 ENCOUNTER — Encounter (RURAL_HEALTH_CENTER): Payer: Self-pay | Admitting: Family

## 2016-10-17 ENCOUNTER — Ambulatory Visit: Payer: PRIVATE HEALTH INSURANCE | Attending: Family | Admitting: Family

## 2016-10-17 VITALS — BP 108/80 | HR 88 | Temp 98.7°F | Resp 16 | Ht 64.02 in | Wt 224.0 lb

## 2016-10-17 DIAGNOSIS — J309 Allergic rhinitis, unspecified: Secondary | ICD-10-CM

## 2016-10-17 DIAGNOSIS — J029 Acute pharyngitis, unspecified: Secondary | ICD-10-CM

## 2016-10-17 LAB — POCT RAPID STREP A: Rapid Strep A Screen POCT: NEGATIVE

## 2016-10-17 MED ORDER — ALPRAZOLAM 0.25 MG PO TABS
ORAL_TABLET | ORAL | 0 refills | Status: DC
Start: ? — End: 2016-10-17

## 2016-10-17 MED ORDER — FLUTICASONE PROPIONATE 50 MCG/ACT NA SUSP
1.0000 | Freq: Every day | NASAL | 2 refills | Status: DC
Start: ? — End: 2016-10-17

## 2016-10-17 NOTE — Patient Instructions (Signed)
Nasal Allergies: Related Problems  Allergies can cause nasal passages to swell. This narrows the air passages. Allergies also cause increased mucus production in the nose.These changes result in nasal allergy symptoms. Common symptoms include itching, sneezing, stuffy nose, and runny nose. Nasal allergies can also cause problems in other parts of the respiratory system. Some of the more common problems are discussed below. If you think you have any of these problems, talk to your healthcare provider about treatment choices.    Sinus infections  Fluid may be trapped in the sinuses. Bacteria may grow in trapped fluid. This causes sinus infection (sinusitis).  Conjunctivitis  Allergens irritate your eyes, including the lining of the conjunctiva. This causes eyes to become red, itchy, puffy, and watery.  Ear problems  The eustachian tube connects the middle ear to nasal passages. Allergies can block this tube, andmake the ears feel plugged. Fluid may also build up, leading to an ear infection (otitis media).  Nasal polyps  Allergies cause nasal passages to swell. Constant swelling can lead to formation of a sac called a polyp. Polyps can grow large enough to block nasal passages.  Asthma  Asthma is inflammation and swelling of the air passages in the lungs. The symptoms are wheezing, shortness of breath, coughing, and chest tightness. Allergies, including nasal allergies, are common in people with asthma.  Date Last Reviewed: 01/19/2015   2000-2017 [employer]. [address], [place] [address]. All rights reserved. This information is not intended as a substitute for professional medical care. Always follow your healthcare professional's instructions.

## 2016-10-17 NOTE — Progress Notes (Signed)
Subjective:    Patient ID: Anita Aguilar is a 46 y.o. female.    Sore Throat    This is a new problem. The current episode started in the past 7 days. The problem has been unchanged. The pain is worse on the right side. There has been no fever. The pain is at a severity of 4/10. Associated symptoms include congestion, coughing, ear pain, headaches, a plugged ear sensation and swollen glands. Pertinent negatives include no shortness of breath, trouble swallowing or vomiting. She has had exposure to strep. She has tried cool liquids and gargles for the symptoms. The treatment provided mild relief.   C/O itching nasopharynx and ears.      The following portions of the patient's history were reviewed and updated as appropriate: allergies, current medications, past family history, past medical history, past social history, past surgical history and problem list.    Review of Systems   Constitutional: Negative for chills, diaphoresis and fever.   HENT: Positive for congestion, ear pain, postnasal drip, sinus pressure, sneezing, sore throat and tinnitus. Negative for trouble swallowing.    Eyes: Positive for itching.   Respiratory: Positive for cough. Negative for chest tightness and shortness of breath.    Cardiovascular: Negative for chest pain, palpitations and leg swelling.   Gastrointestinal: Negative for nausea and vomiting.   Skin: Negative for rash.   Neurological: Positive for dizziness and headaches.   All other systems reviewed and are negative.        Objective:      BP 108/80   Pulse 88   Temp 98.7 F (37.1 C) (Temporal Artery)   Resp 16   Ht 1.626 m (5' 4.02")   Wt 101.6 kg (224 lb)   BMI 38.43 kg/m     Physical Exam   Constitutional: She is oriented to person, place, and time. Vital signs are normal. She appears well-developed and well-nourished. She does not appear ill. No distress.   HENT:   Head: Normocephalic and atraumatic.   Right Ear: External ear normal. Tympanic membrane is retracted.    Left Ear: Tympanic membrane and external ear normal.   Nose: Right sinus exhibits frontal sinus tenderness. Right sinus exhibits no maxillary sinus tenderness. Left sinus exhibits frontal sinus tenderness. Left sinus exhibits no maxillary sinus tenderness.   Mouth/Throat: Uvula is midline and mucous membranes are normal. Posterior oropharyngeal edema and posterior oropharyngeal erythema present. No oropharyngeal exudate or tonsillar abscesses.   Eyes: Conjunctivae are normal. Pupils are equal, round, and reactive to light. Right eye exhibits no discharge. Left eye exhibits no discharge. No scleral icterus.   Neck: Normal range of motion. Neck supple.   Cardiovascular: Normal rate, regular rhythm, normal heart sounds and intact distal pulses.    Pulmonary/Chest: Effort normal and breath sounds normal.   Lymphadenopathy:     She has no cervical adenopathy.   Neurological: She is alert and oriented to person, place, and time.   Skin: Skin is warm and dry. Capillary refill takes less than 2 seconds. She is not diaphoretic.   Psychiatric: She has a normal mood and affect. Her behavior is normal.   Nursing note and vitals reviewed.      rapid strep: negative  Assessment:       1. Allergic sinusitis    2. Sore throat          Plan:       Flonase 50 mcg/ACT nasal spray, 1 spray each naris daily  OTC antihistamine  of choice  Stay well hydrated; drink at least 64 oz of water daily  Follow up if not improving in next 7-10 days; sooner if worse      Procedures

## 2016-11-12 ENCOUNTER — Other Ambulatory Visit (RURAL_HEALTH_CENTER): Payer: Self-pay | Admitting: Family

## 2017-03-25 ENCOUNTER — Other Ambulatory Visit (RURAL_HEALTH_CENTER): Payer: Self-pay

## 2017-03-25 MED ORDER — METFORMIN HCL 500 MG PO TABS
500.0000 mg | ORAL_TABLET | Freq: Every morning | ORAL | 1 refills | Status: DC
Start: ? — End: 2017-03-25

## 2017-03-25 MED ORDER — LISINOPRIL 10 MG PO TABS
10.0000 mg | ORAL_TABLET | Freq: Every day | ORAL | 1 refills | Status: DC
Start: ? — End: 2017-03-25

## 2017-03-26 ENCOUNTER — Other Ambulatory Visit (RURAL_HEALTH_CENTER): Payer: Self-pay | Admitting: Family

## 2017-03-26 MED ORDER — LISINOPRIL 10 MG PO TABS
10.0000 mg | ORAL_TABLET | Freq: Every day | ORAL | 1 refills | Status: DC
Start: ? — End: 2017-03-26

## 2017-03-26 MED ORDER — METFORMIN HCL 500 MG PO TABS
500.0000 mg | ORAL_TABLET | Freq: Every morning | ORAL | 1 refills | Status: DC
Start: ? — End: 2017-03-26

## 2017-04-09 ENCOUNTER — Other Ambulatory Visit (RURAL_HEALTH_CENTER): Payer: Self-pay

## 2017-04-09 MED ORDER — ALPRAZOLAM 0.25 MG PO TABS
ORAL_TABLET | ORAL | 0 refills | Status: DC
Start: ? — End: 2017-04-09
